# Patient Record
Sex: Female | Born: 1990
Health system: Southern US, Community
[De-identification: ages and names within clinical notes are randomized; demographics above are authoritative.]

## PROBLEM LIST (undated history)

## (undated) DIAGNOSIS — R87629 Unspecified abnormal cytological findings in specimens from vagina: Secondary | ICD-10-CM

## (undated) DIAGNOSIS — E079 Disorder of thyroid, unspecified: Secondary | ICD-10-CM

## (undated) DIAGNOSIS — G43909 Migraine, unspecified, not intractable, without status migrainosus: Secondary | ICD-10-CM

## (undated) DIAGNOSIS — F988 Other specified behavioral and emotional disorders with onset usually occurring in childhood and adolescence: Secondary | ICD-10-CM

## (undated) HISTORY — PX: COLPOSCOPY: SHX161

## (undated) HISTORY — DX: Unspecified abnormal cytological findings in specimens from vagina: R87.629

## (undated) HISTORY — PX: WISDOM TOOTH EXTRACTION: SHX21

## (undated) HISTORY — DX: Disorder of thyroid, unspecified: E07.9

## (undated) HISTORY — DX: Other specified behavioral and emotional disorders with onset usually occurring in childhood and adolescence: F98.8

## (undated) HISTORY — DX: Migraine, unspecified, not intractable, without status migrainosus: G43.909

---
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
## 2009-06-08 ENCOUNTER — Encounter: Payer: Self-pay | Admitting: Orthopedic Surgery

## 2009-06-08 ENCOUNTER — Emergency Department (HOSPITAL_COMMUNITY): Admission: EM | Admit: 2009-06-08 | Discharge: 2009-06-08 | Payer: Self-pay | Admitting: Emergency Medicine

## 2009-06-09 ENCOUNTER — Ambulatory Visit: Payer: Self-pay | Admitting: Orthopedic Surgery

## 2009-06-09 DIAGNOSIS — S82409A Unspecified fracture of shaft of unspecified fibula, initial encounter for closed fracture: Secondary | ICD-10-CM | POA: Insufficient documentation

## 2009-06-16 ENCOUNTER — Ambulatory Visit: Payer: Self-pay | Admitting: Orthopedic Surgery

## 2009-06-17 ENCOUNTER — Telehealth: Payer: Self-pay | Admitting: Orthopedic Surgery

## 2009-07-07 ENCOUNTER — Ambulatory Visit: Payer: Self-pay | Admitting: Orthopedic Surgery

## 2009-08-04 ENCOUNTER — Ambulatory Visit: Payer: Self-pay | Admitting: Orthopedic Surgery

## 2010-06-29 IMAGING — CR DG TIBIA/FIBULA 2V*L*
2 series · 2 of 2 positions shown · non-contrast
Comparison: Plain films left ankle this same date.

CLINICAL DATA: Injury, pain.

LEFT TIBIA AND FIBULA - 2 VIEW

[view not recorded (1 of 2)]
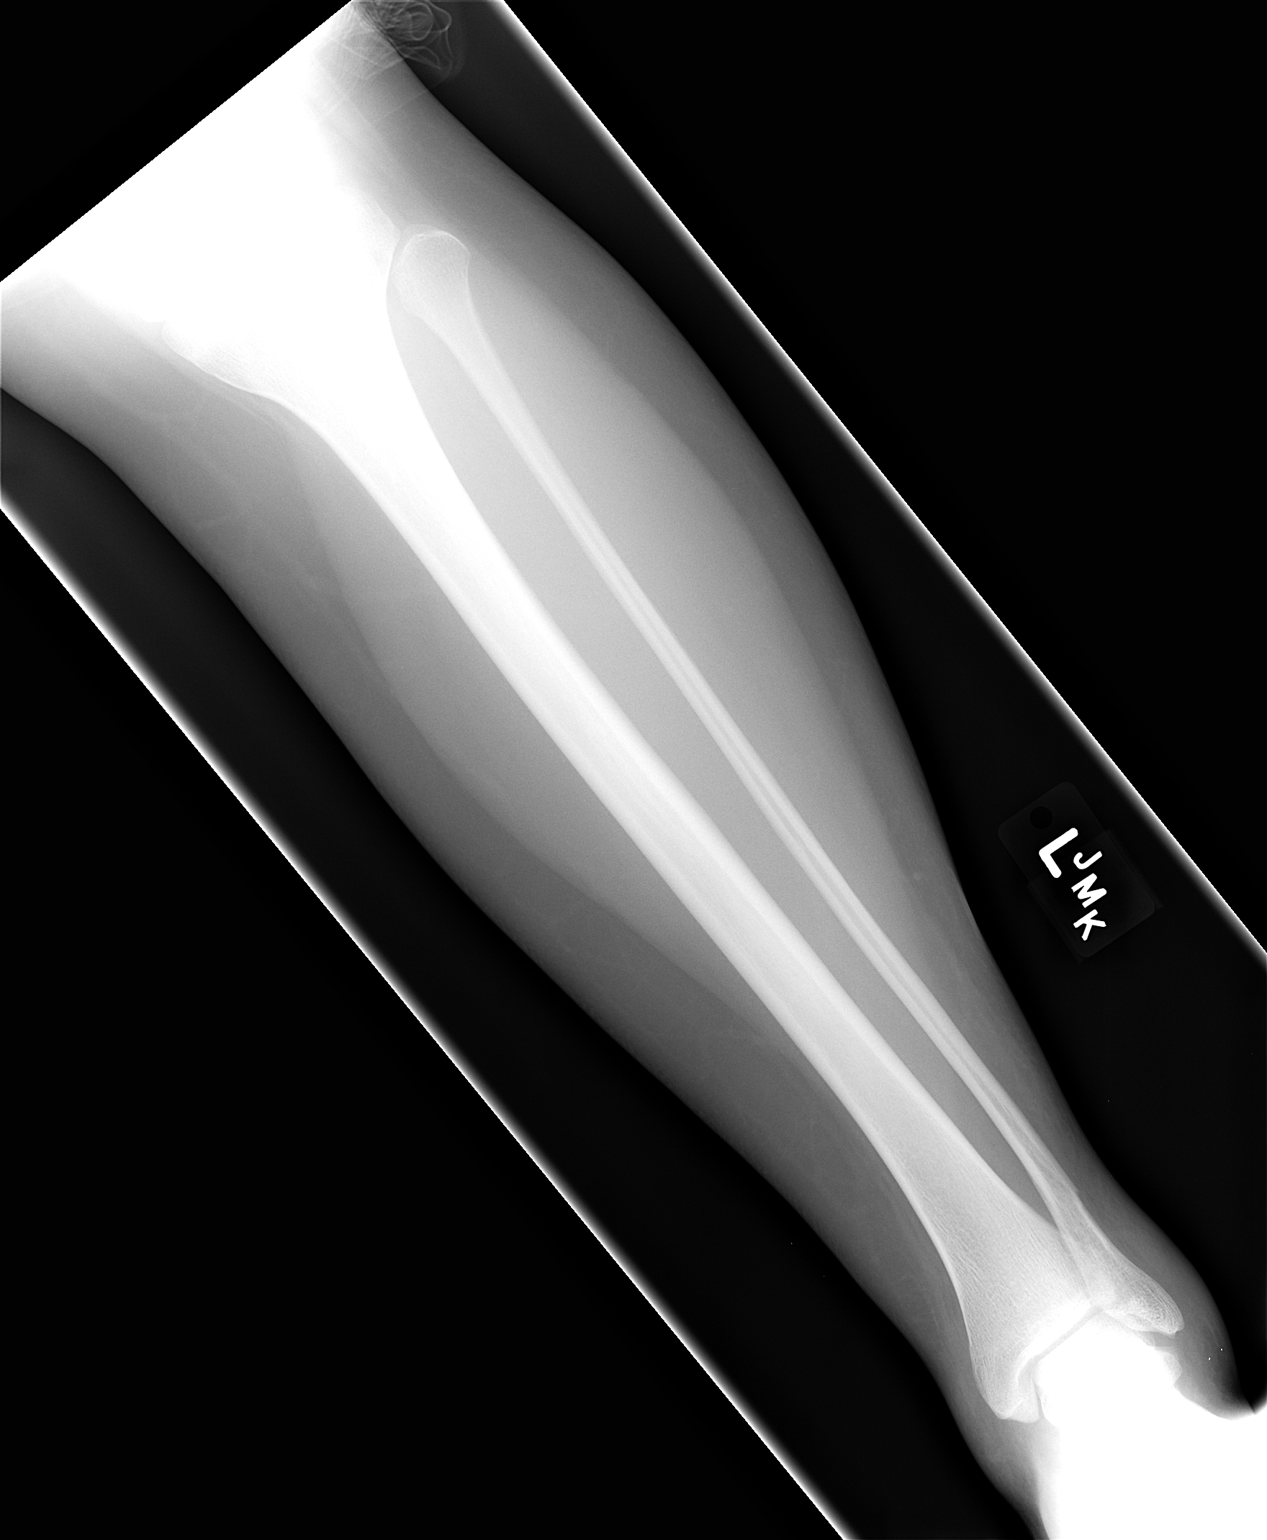

[view not recorded (2 of 2)]
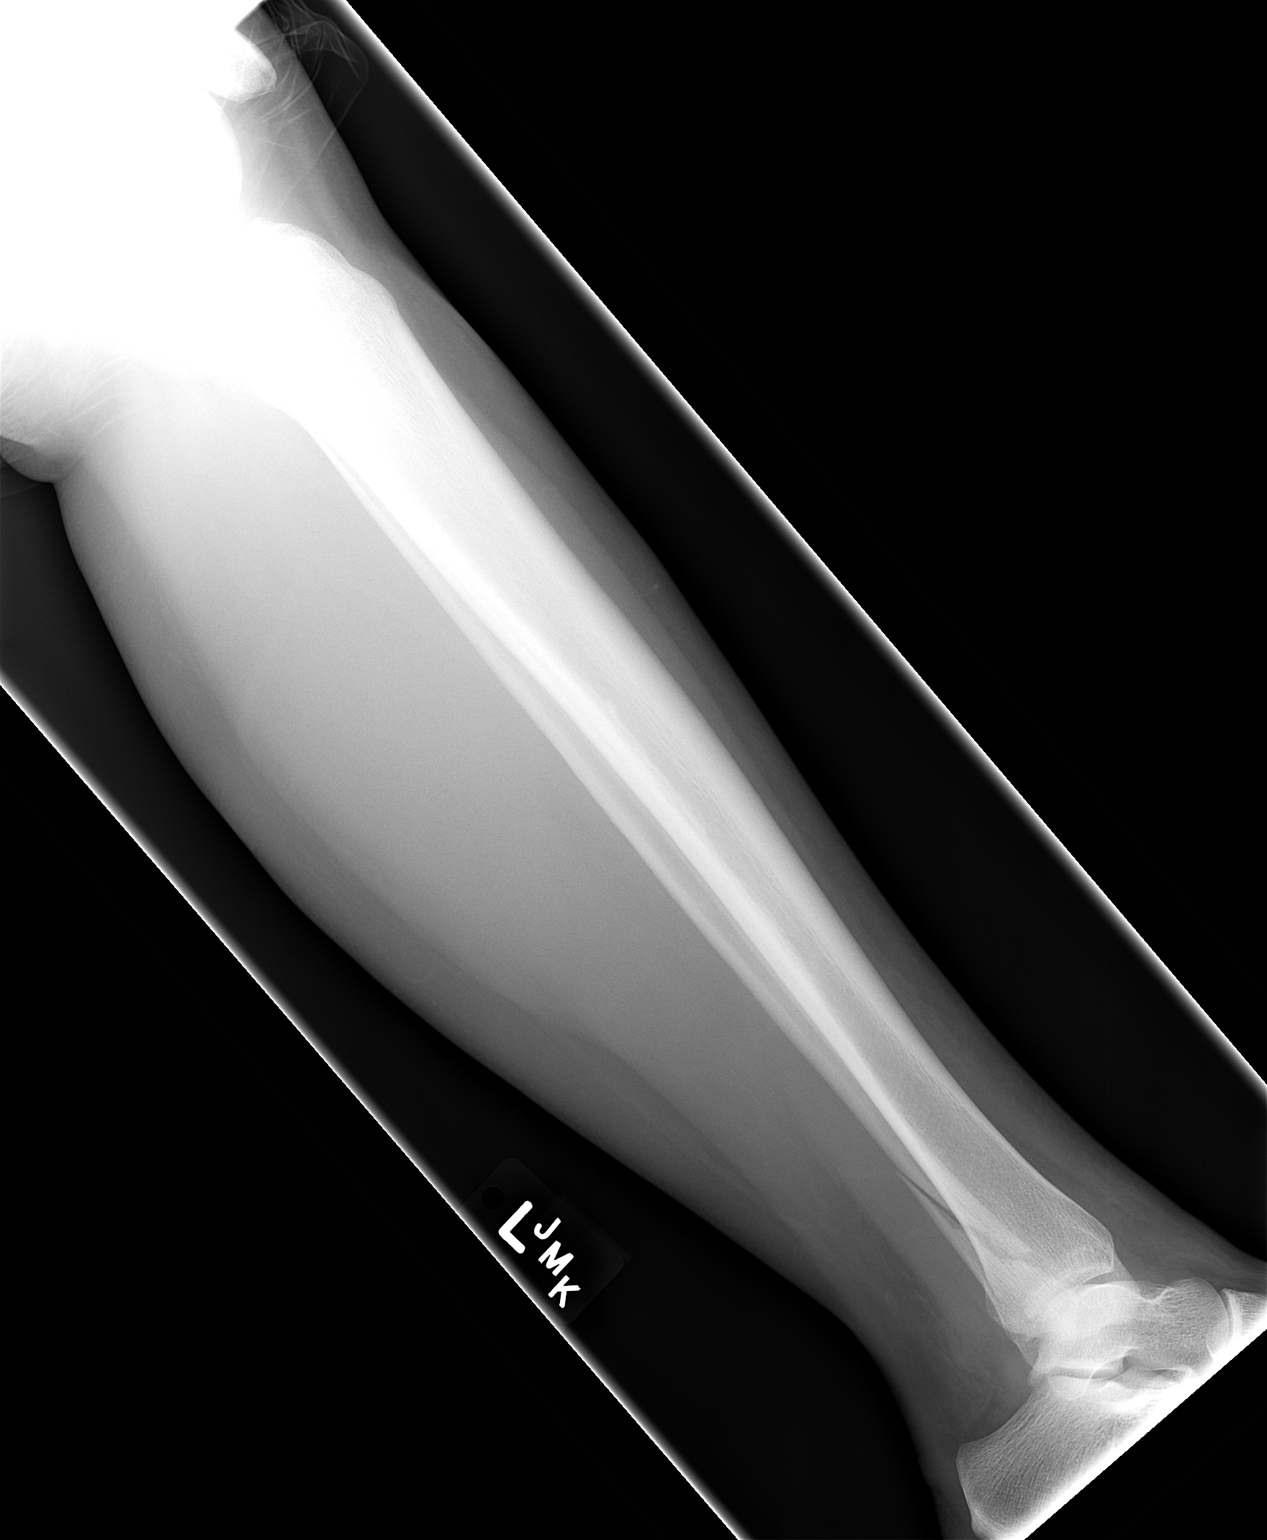

[2 of 2 positions shown; findings below may reference images not displayed]

FINDINGS: Fracture off the distal fibular diaphysis is again noted.
No other acute bony or joint abnormality is seen.
IMPRESSION: Distal fibular fracture.

## 2013-05-10 ENCOUNTER — Encounter: Payer: Self-pay | Admitting: *Deleted

## 2013-05-12 ENCOUNTER — Encounter: Payer: Self-pay | Admitting: *Deleted

## 2013-05-14 ENCOUNTER — Ambulatory Visit (INDEPENDENT_AMBULATORY_CARE_PROVIDER_SITE_OTHER): Payer: Managed Care, Other (non HMO) | Admitting: Family Medicine

## 2013-05-14 ENCOUNTER — Encounter: Payer: Self-pay | Admitting: Family Medicine

## 2013-05-14 VITALS — BP 118/84 | Ht 65.25 in | Wt 181.0 lb

## 2013-05-14 DIAGNOSIS — F909 Attention-deficit hyperactivity disorder, unspecified type: Secondary | ICD-10-CM

## 2013-05-14 MED ORDER — DEXMETHYLPHENIDATE HCL ER 20 MG PO CP24: 20.0000 mg | ORAL_CAPSULE | Freq: Every day | ORAL | Status: DC

## 2013-05-14 NOTE — Progress Notes (Signed)
  Subjective:    Patient ID: Sarah Blanchard, female    DOB: [birth year], 22 y.o.   MRN: [id number]  HPIHere for ADD check up. Not currently taking any ADD meds. Last prescribed was Ritalin. Didn't help.notes struggling in school. Difficulty focusing on studies and affecting hi studies.   Was on add med way back in first gr. Pt states it did not work well. Worked, did its job.   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  Occurs at school and at homework too. Trouble getting things accomplished.     Review of Systems  Allergic/Immunologic: Negative for environmental allergies.   no chest pain no back pain no loss of consciousness     Objective:   Physical Exam  Alert no acute distress. HEENT normal. Lungs clear. Heart regular rate and rhythm. Neuro exam intact.      Assessment & Plan:  Impression ADHD discussed at length including multiple options plan Focalin XR 20 mg every morning rationale discussed 3 months worth written recheck in 3 months call us in one month with status. WSL

## 2013-06-22 ENCOUNTER — Telehealth: Payer: Self-pay | Admitting: Family Medicine

## 2013-06-22 MED ORDER — DEXMETHYLPHENIDATE HCL ER 30 MG PO CP24: ORAL_CAPSULE | ORAL | Status: DC

## 2013-06-22 NOTE — Telephone Encounter (Signed)
Ok increase to 30xr write two rxs. rec ov towards end of second

## 2013-06-22 NOTE — Telephone Encounter (Signed)
Rx printed and left up front for patient pick up. Patient notified. 

## 2013-06-22 NOTE — Telephone Encounter (Signed)
Patient says that after taking Focalin XR for a month, she says she thinks it needs to be increased because she finds herself daydreaming just a little bit.

## 2013-06-25 ENCOUNTER — Telehealth: Payer: Self-pay | Admitting: Family Medicine

## 2013-06-25 ENCOUNTER — Other Ambulatory Visit: Payer: Self-pay | Admitting: *Deleted

## 2013-06-25 MED ORDER — DEXMETHYLPHENIDATE HCL ER 30 MG PO CP24: 30.0000 mg | ORAL_CAPSULE | Freq: Every day | ORAL | Status: DC

## 2013-06-25 NOTE — Telephone Encounter (Signed)
See 11 7 tele message. Change to written as pt requests (two rxs)

## 2013-06-25 NOTE — Telephone Encounter (Signed)
[facility] 11/10

## 2013-06-25 NOTE — Telephone Encounter (Signed)
Dexmethylphenidate HCl 30 MG CP24  Pt needs name brand only, please re write and call  Pt with new script to change out. Her ins. Requires it to be  Name brand

## 2013-06-26 NOTE — Telephone Encounter (Signed)
Spoke with pt. She is turning in her generic scripts and picking up brand scripts.

## 2013-12-25 ENCOUNTER — Encounter: Payer: Self-pay | Admitting: Family Medicine

## 2013-12-25 ENCOUNTER — Ambulatory Visit (INDEPENDENT_AMBULATORY_CARE_PROVIDER_SITE_OTHER): Payer: No Typology Code available for payment source | Admitting: Family Medicine

## 2013-12-25 VITALS — BP 114/72 | Ht 64.0 in | Wt 172.0 lb

## 2013-12-25 DIAGNOSIS — F909 Attention-deficit hyperactivity disorder, unspecified type: Secondary | ICD-10-CM

## 2013-12-25 MED ORDER — DEXMETHYLPHENIDATE HCL ER 30 MG PO CP24: 30.0000 mg | ORAL_CAPSULE | Freq: Every day | ORAL | Status: DC

## 2013-12-25 NOTE — Progress Notes (Signed)
   Subjective:    Patient ID: Sarah Blanchard, female    DOB: 1991/03/18, 23 y.o.   MRN: [id number]  HPIADD check up. Patient states no concerns. Focalin is working well for patient. Doing good in school and at work.   Feels jittery and shaky if doesn't eat  States the 30 mg dosages definitely helps. 20 mg did not help as much.  Exercising regularly. Next  No headache no change in appetite no trouble sleeping.  Review of Systems No chest pain no back pain no change in bowel habits ROS otherwise negative    Objective:   Physical Exam  Alert no apparent distress. HEENT normal. Lungs clear. Heart regular in rhythm. Neuro exam intact.      Assessment & Plan:  Impression ADHD quickly stable plan maintain same meds. Diet exercise discussed. Recheck in 4 months. WSL in an in and in

## 2014-06-25 ENCOUNTER — Ambulatory Visit (INDEPENDENT_AMBULATORY_CARE_PROVIDER_SITE_OTHER): Payer: No Typology Code available for payment source | Admitting: Family Medicine

## 2014-06-25 ENCOUNTER — Encounter: Payer: Self-pay | Admitting: Family Medicine

## 2014-06-25 VITALS — BP 120/70 | Ht 64.0 in | Wt 171.2 lb

## 2014-06-25 DIAGNOSIS — F902 Attention-deficit hyperactivity disorder, combined type: Secondary | ICD-10-CM

## 2014-06-25 DIAGNOSIS — D2262 Melanocytic nevi of left upper limb, including shoulder: Secondary | ICD-10-CM

## 2014-06-25 MED ORDER — DEXMETHYLPHENIDATE HCL ER 30 MG PO CP24: 30.0000 mg | ORAL_CAPSULE | Freq: Every day | ORAL | Status: DC

## 2014-06-25 NOTE — Progress Notes (Signed)
   Subjective:    Patient ID: Sarah Blanchard, female    DOB: 1991-06-25, 23 y.o.   MRN: [id number]  HPI  Patient arrives for an ADHD check up. Patient currently on Focalin XR 30mg . Patient also has spot on ri  Staying busy with nursing program  Exercise regimen fairly regular and at home mostly  Feels the focalin dose is working well  Day numb [phone]   ght shoulder she would like checked to see if skin cancer. Review of Systems No headache no chest pain no back pain no loss weight gain    Objective:   Physical Exam  Alert vital stable HEENT normal. Lungs clear. Heart rare rate and rhythm. Neuro exam intact. Left shoulder nevus somewhat complicated in appearance irregular      Assessment & Plan:  Impression 1 ADHD good control discussed #2 nevus needs referral plan referral, diet exercise discussed, ADHD meds refilled for 4 months. WSL

## 2015-01-01 ENCOUNTER — Ambulatory Visit (INDEPENDENT_AMBULATORY_CARE_PROVIDER_SITE_OTHER): Payer: Managed Care, Other (non HMO) | Admitting: Family Medicine

## 2015-01-01 ENCOUNTER — Encounter: Payer: Self-pay | Admitting: Family Medicine

## 2015-01-01 VITALS — BP 118/72 | Ht 63.0 in | Wt 158.0 lb

## 2015-01-01 DIAGNOSIS — M545 Low back pain, unspecified: Secondary | ICD-10-CM

## 2015-01-01 DIAGNOSIS — Z0000 Encounter for general adult medical examination without abnormal findings: Secondary | ICD-10-CM | POA: Diagnosis not present

## 2015-01-01 DIAGNOSIS — F902 Attention-deficit hyperactivity disorder, combined type: Secondary | ICD-10-CM

## 2015-01-01 DIAGNOSIS — Z111 Encounter for screening for respiratory tuberculosis: Secondary | ICD-10-CM | POA: Diagnosis not present

## 2015-01-01 DIAGNOSIS — Z139 Encounter for screening, unspecified: Secondary | ICD-10-CM

## 2015-01-01 NOTE — Progress Notes (Signed)
   Subjective:    Patient ID: Sarah Blanchard, female    DOB: [birth year], 24 y.o.   MRN: [id number]  HPI The patient comes in today for a wellness visit.  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  Vision 20/20 bilateral without correction.  Color blindness test was normal.   A review of their health history was completed.  A review of medications was also completed.  Any needed refills; none  Eating habits: health conscious  Falls/  MVA accidents in past few months: none  Regular exercise: weight lifting, running and jogging  Specialist pt sees on regular basis: Dr. Aloha Gell ob/gyn  Preventative health issues were discussed.   Additional concerns: refill on adipex. Was originally filled by gyn. Pt states she has been taking for 2 months. Pt has lost 40lbs.   Stopped the focalin, adipex sems to partially help,   Patient has history of ADHD. Was on Focalin 4. Seem to be helping. Patient's GYN started her on phentermine. She has stopped the Focalin therefore.  Patient has history of reflux. Nexium generally helps her symptomatology.    Review of Systems  Constitutional: Negative for activity change, appetite change and fatigue.  HENT: Negative for congestion, ear discharge and rhinorrhea.   Eyes: Negative for discharge.  Respiratory: Negative for cough, chest tightness and wheezing.   Cardiovascular: Negative for chest pain.  Gastrointestinal: Negative for vomiting and abdominal pain.  Genitourinary: Negative for frequency and difficulty urinating.  Musculoskeletal: Negative for neck pain.  Allergic/Immunologic: Negative for environmental allergies and food allergies.  Neurological: Negative for weakness and headaches.  Psychiatric/Behavioral: Negative for behavioral problems and agitation.  All other systems reviewed and are negative.      Objective:   Physical Exam  Constitutional: She is oriented to person, place, and time. She appears well-developed  and well-nourished.  HENT:  Head: Normocephalic.  Right Ear: External ear normal.  Left Ear: External ear normal.  Eyes: Pupils are equal, round, and reactive to light.  Neck: Normal range of motion. No thyromegaly present.  Cardiovascular: Normal rate, regular rhythm, normal heart sounds and intact distal pulses.   No murmur heard. Pulmonary/Chest: Effort normal and breath sounds normal. No respiratory distress. She has no wheezes.  Abdominal: Soft. Bowel sounds are normal. She exhibits no distension and no mass. There is no tenderness.  Musculoskeletal: Normal range of motion. She exhibits no edema or tenderness.  Lymphadenopathy:    She has no cervical adenopathy.  Neurological: She is alert and oriented to person, place, and time. She exhibits normal muscle tone.  Skin: Skin is warm and dry.  Psychiatric: She has a normal mood and affect. Her behavior is normal.  Vitals reviewed.         Assessment & Plan:  Impression 1 ADHD overall stable per patient #2 low back pain discuss patient notes worse with certain motions. Comes and goes. Does try to exercise some. #3 weight loss patient currently prescribed phentermine by specialist and form filled out. Appropriate blood tests to evaluate PPD status. Form filled out. Of note this is not a true physicals since that is provided through GYN. Diet exercise discussed also

## 2015-01-04 LAB — QUANTIFERON IN TUBE
QFT TB AG MINUS NIL VALUE: <0 IU/mL
QUANTIFERON MITOGEN VALUE: 9.77 IU/mL
QUANTIFERON TB AG VALUE: 0.03 IU/mL
QUANTIFERON TB GOLD: NEGATIVE
Quantiferon Nil Value: 0.04 IU/mL

## 2015-01-04 LAB — QUANTIFERON TB GOLD ASSAY (BLOOD)

## 2015-09-30 ENCOUNTER — Ambulatory Visit (INDEPENDENT_AMBULATORY_CARE_PROVIDER_SITE_OTHER): Payer: Managed Care, Other (non HMO) | Admitting: Family Medicine

## 2015-09-30 ENCOUNTER — Encounter: Payer: Self-pay | Admitting: Family Medicine

## 2015-09-30 VITALS — BP 112/70 | Ht 64.0 in | Wt 174.0 lb

## 2015-09-30 DIAGNOSIS — F902 Attention-deficit hyperactivity disorder, combined type: Secondary | ICD-10-CM

## 2015-09-30 MED ORDER — DEXMETHYLPHENIDATE HCL ER 30 MG PO CP24: 30.0000 mg | ORAL_CAPSULE | Freq: Every day | ORAL | Status: DC

## 2015-09-30 NOTE — Progress Notes (Signed)
   Subjective:    Patient ID: Sarah Blanchard, female    DOB: 04/19/1991, 25 y.o.   MRN: [id number]  HPI Patient was seen today for ADD checkup. -weight, vital signs reviewed.  The following items were covered. 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

## 2016-03-19 DIAGNOSIS — Z6831 Body mass index (BMI) 31.0-31.9, adult: Secondary | ICD-10-CM | POA: Diagnosis not present

## 2016-03-19 DIAGNOSIS — E669 Obesity, unspecified: Secondary | ICD-10-CM | POA: Diagnosis not present

## 2016-04-20 DIAGNOSIS — E669 Obesity, unspecified: Secondary | ICD-10-CM | POA: Diagnosis not present

## 2016-04-20 DIAGNOSIS — Z6832 Body mass index (BMI) 32.0-32.9, adult: Secondary | ICD-10-CM | POA: Diagnosis not present

## 2016-06-24 DIAGNOSIS — W57XXXA Bitten or stung by nonvenomous insect and other nonvenomous arthropods, initial encounter: Secondary | ICD-10-CM | POA: Diagnosis not present

## 2016-06-24 DIAGNOSIS — B9689 Other specified bacterial agents as the cause of diseases classified elsewhere: Secondary | ICD-10-CM | POA: Diagnosis not present

## 2016-06-24 DIAGNOSIS — L089 Local infection of the skin and subcutaneous tissue, unspecified: Secondary | ICD-10-CM | POA: Diagnosis not present

## 2016-06-24 DIAGNOSIS — Z719 Counseling, unspecified: Secondary | ICD-10-CM | POA: Diagnosis not present

## 2016-06-24 DIAGNOSIS — S30861A Insect bite (nonvenomous) of abdominal wall, initial encounter: Secondary | ICD-10-CM | POA: Diagnosis not present

## 2016-06-28 ENCOUNTER — Encounter: Payer: Self-pay | Admitting: Family Medicine

## 2016-06-28 ENCOUNTER — Ambulatory Visit (INDEPENDENT_AMBULATORY_CARE_PROVIDER_SITE_OTHER): Payer: 59 | Admitting: Family Medicine

## 2016-06-28 VITALS — BP 110/72 | Temp 98.4°F | Ht 63.0 in | Wt 189.8 lb

## 2016-06-28 DIAGNOSIS — J029 Acute pharyngitis, unspecified: Secondary | ICD-10-CM

## 2016-06-28 DIAGNOSIS — I889 Nonspecific lymphadenitis, unspecified: Secondary | ICD-10-CM

## 2016-06-28 LAB — POCT RAPID STREP A (OFFICE): Rapid Strep A Screen: NEGATIVE

## 2016-06-28 MED ORDER — AZITHROMYCIN 250 MG PO TABS
ORAL_TABLET | ORAL | 0 refills | Status: DC
Start: 2016-06-28 — End: 2018-03-07

## 2016-06-28 NOTE — Progress Notes (Signed)
   Subjective:    Patient ID: Sarah Blanchard, female    DOB: 01-Dec-1990, 25 y.o.   MRN: [id number]  Sore Throat   This is a new problem. The current episode started yesterday. Associated symptoms include headaches and vomiting. Associated symptoms comments: Fever, muscle aches. Treatments tried: max flu.   Sore throat hit prety hard  Feels achey  occas cough  Results for orders placed or performed in visit on 06/28/16  POCT rapid strep A  Result Value Ref Range   Rapid Strep A Screen Negative Negative    vo times   300 seen sickness  Used to have zofran none now,      Review of Systems  Gastrointestinal: Positive for vomiting.  Neurological: Positive for headaches.       Objective:   Physical Exam Alert no acute distress but moderate malaise lungs clear. Heart rare rhythm pharynx erythematous tender anterior nodes. 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 Strep screen negative plan Z-Pak for atypical agents. Has been around a lot of community acquired pneumonia lately symptom care discussed

## 2016-06-29 LAB — STREP A DNA PROBE: Strep Gp A Direct, DNA Probe: NEGATIVE

## 2017-08-29 DIAGNOSIS — Z131 Encounter for screening for diabetes mellitus: Secondary | ICD-10-CM | POA: Diagnosis not present

## 2017-08-29 DIAGNOSIS — R635 Abnormal weight gain: Secondary | ICD-10-CM | POA: Diagnosis not present

## 2017-08-29 DIAGNOSIS — Z6835 Body mass index (BMI) 35.0-35.9, adult: Secondary | ICD-10-CM | POA: Diagnosis not present

## 2017-08-29 DIAGNOSIS — Z9889 Other specified postprocedural states: Secondary | ICD-10-CM | POA: Diagnosis not present

## 2017-08-29 DIAGNOSIS — Z0000 Encounter for general adult medical examination without abnormal findings: Secondary | ICD-10-CM | POA: Diagnosis not present

## 2017-08-29 DIAGNOSIS — Z01419 Encounter for gynecological examination (general) (routine) without abnormal findings: Secondary | ICD-10-CM | POA: Diagnosis not present

## 2017-10-04 DIAGNOSIS — D2230 Melanocytic nevi of unspecified part of face: Secondary | ICD-10-CM | POA: Diagnosis not present

## 2017-10-04 DIAGNOSIS — M25549 Pain in joints of unspecified hand: Secondary | ICD-10-CM | POA: Diagnosis not present

## 2017-10-04 DIAGNOSIS — L309 Dermatitis, unspecified: Secondary | ICD-10-CM | POA: Diagnosis not present

## 2017-12-20 DIAGNOSIS — L408 Other psoriasis: Secondary | ICD-10-CM | POA: Diagnosis not present

## 2018-02-15 ENCOUNTER — Encounter: Payer: Self-pay | Admitting: *Deleted

## 2018-02-15 ENCOUNTER — Ambulatory Visit: Payer: 59 | Admitting: Gastroenterology

## 2018-02-15 ENCOUNTER — Other Ambulatory Visit: Payer: Self-pay | Admitting: *Deleted

## 2018-02-15 ENCOUNTER — Encounter: Payer: Self-pay | Admitting: Gastroenterology

## 2018-02-15 ENCOUNTER — Telehealth: Payer: Self-pay | Admitting: *Deleted

## 2018-02-15 DIAGNOSIS — R1310 Dysphagia, unspecified: Secondary | ICD-10-CM

## 2018-02-15 DIAGNOSIS — R1013 Epigastric pain: Secondary | ICD-10-CM

## 2018-02-15 DIAGNOSIS — K219 Gastro-esophageal reflux disease without esophagitis: Secondary | ICD-10-CM

## 2018-02-15 MED ORDER — PANTOPRAZOLE SODIUM 40 MG PO TBEC: DELAYED_RELEASE_TABLET | ORAL | 11 refills | Status: DC

## 2018-02-15 NOTE — Assessment & Plan Note (Signed)
SYMPTOMS NOT IDEALLY CONTROLLED AND LIKELY DUE TO GERD.   DRINK WATER TO KEEP YOUR URINE LIGHT YELLOW. CONTINUE YOUR WEIGHT LOSS EFFORTS. A WEIGHT OF 173 LBS OR LESS WILL KEEP HER BODY MASS INDEX(BMI) UNDER 30. PT AWARE. STRICTLY FOLLOW A LOW FAT DIET. MEATS SHOULD BE BAKED, BROILED, OR BOILED. AVOID FRIED FOODS.  HANDOUT GIVEN. STRICTLY AVOID REFLUX TRIGGERS.  HANDOUT GIVEN. CONTINUE PROTONIX. INCREASE TO 30 MINUTES PRIOR TO MEALS TWICE DAILY. COMPLETE UPPER ENDOSCOPY/POSSIBLE DILATION IN 2 3 WEEKS. DISCUSSED PROCEDURE, BENEFITS, & RISKS: < 1% chance of medication reaction, bleeding, OR perforation.  FOLLOW UP IN 4 MOS.

## 2018-02-15 NOTE — Assessment & Plan Note (Signed)
SYMPTOMS FAIRLY WELL CONTROLLED AND LIKELY DUE TO UNCONTROLLED GERD.   DRINK WATER TO KEEP YOUR URINE LIGHT YELLOW. CONTINUE YOUR WEIGHT LOSS EFFORTS. A WEIGHT OF 173 LBS OR LESS WILL KEEP YOUR BODY MASS INDEX(BMI) UNDER 30. FOLLOW A LOW FAT DIET. MEATS SHOULD BE BAKED, BROILED, OR BOILED. AVOID FRIED FOODS.  HANDOUT GIVEN. AVOID REFLUX TRIGGERS.  HANDOUT GIVEN. CONTINUE PROTONIX. INCREASE TO  30 MINUTES PRIOR TO MEALS TWICE DAILY. COMPLETE UPPER ENDOSCOPY/POSSIBLE DILATION IN 2 3 WEEKS. DISCUSSED PROCEDURE, BENEFITS, & RISKS: < 1% chance of medication reaction, bleeding, OR perforation.  FOLLOW UP IN 4 MOS.

## 2018-02-15 NOTE — Telephone Encounter (Signed)
The notification/prior authorization reference number is [id number] for PA started via [facility] website for EGD +/-DIL.

## 2018-02-15 NOTE — Assessment & Plan Note (Signed)
SYMPTOMS NOT IDEALLY CONTROLLED.   DRINK WATER TO KEEP YOUR URINE LIGHT YELLOW. CONTINUE YOUR WEIGHT LOSS EFFORTS. A WEIGHT OF 173 LBS OR LESS WILL KEEP YOUR BODY MASS INDEX(BMI) UNDER 30. STRICTLY FOLLOW A LOW FAT DIET. MEATS SHOULD BE BAKED, BROILED, OR BOILED. AVOID FRIED FOODS.  HANDOUT GIVEN. STRICTLY AVOID REFLUX TRIGGERS.  HANDOUT GIVEN. CONTINUE PROTONIX. INCREASE TO 30 MINUTES PRIOR TO MEALS TWICE DAILY. COMPLETE UPPER ENDOSCOPY/POSSIBLE DILATION IN 2 3 WEEKS. DISCUSSED PROCEDURE, BENEFITS, & RISKS: < 1% chance of medication reaction, bleeding, OR perforation.  FOLLOW UP IN 4 MOS.

## 2018-02-15 NOTE — Progress Notes (Signed)
Subjective:    Patient ID: Sarah Blanchard, female    DOB: Sep 23, 1990, 27 y.o.   MRN: [id number]  Mikey Kirschner, MD   HPI RARE MONSTER [place]. WORKING ON WEIGHT LOSS: DOWN 23 LBS SO FAR. HEARTBURN NOT IDEALLY CONTROLLED. SYMPTOMS EVERY DAY. LAST MOST OF THE DAY. MAY BE UP AT LEAST 2 NIGHTS A WEEK WITH SYMPTOMS. SYMPTOMS: BURPING AND BURNING AFTERWARDS AND HURTS IN ESOPHAGUS AND TRACHEA AREA. BMs: EVERY COUPLE OF DAYS. RARE CONSTIPATION @ CYCLES.MAY FEELS LIKE THINGS GETS STUCK GOING DOWN < 1-2X/WEEK. NEVER HAD EGD. USES IBUPROFEN(< 1-2X/MO). ON MYRENA. RARE ETOH. QUIT SMOKING 1 YR AGO. WORKING FULL TIME AND GOING TO SCHOOL FULL TIME. GETTING PSYCH DEGREE. PLANNING ON DOING NURSING.   PT DENIES FEVER, CHILLS, HEMATOCHEZIA, nausea, vomiting, melena, diarrhea, SHORTNESS OF BREATH, CHANGE IN BOWEL IN HABITS, OR problems with sedation.   Past Medical History:  Diagnosis Date  . ADD (attention deficit disorder)   . Migraines   . Thyroid disease 2004   hypothyrodism   Past Surgical History:  Procedure Laterality Date  . THYROIDECTOMY, PARTIAL  2004   Allergies  Allergen Reactions  . Bee Venom   . Sulfa Antibiotics     Current Outpatient Medications  Medication Sig Dispense Refill  . pantoprazole (PROTONIX) 40 MG tablet Take 1 tablet by mouth daily.    .      . Cholecalciferol (VITAMIN D) 2000 UNITS CAPS Take by mouth daily.    .      .        Family History  Problem Relation Age of Onset  . Heart disease Other   . Colon cancer Neg Hx   . Colon polyps Neg Hx    Social History   Socioeconomic History  . Marital status: MARRIED    Spouse name: BEN  . Number of children: NONE  . Years of education: WILL HAVE BACHELOR'S IN [facility]  . Highest education level:   Occupational History  . Not on file  Social Needs  . Financial resource strain: NO STRESS  . Food insecurity:            . Transportation needs:     HAS OWN CAR       Tobacco Use  . Smoking  status: Former Research scientist (life sciences)  . Smokeless tobacco: Never Used  Substance and Sexual Activity  . Alcohol use: Yes    Alcohol/week: 0.0 oz    Comment: occ  . Drug use: Never  . Sexual activity: Not on file  Lifestyle  . Physical activity:            . Stress: MODERATE STRESS DUE TO NURSING SCHOOL  Relationships  . Social connections:                                Other Topics Concern  . Not on file  Social History Narrative   MARRIED. NO KIDS. 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    Objective:   Physical Exam  Constitutional: She is oriented to person, place, and time. She appears well-developed and well-nourished. No distress.  HENT:  Head: Normocephalic and atraumatic.  Mouth/Throat: Oropharynx is clear and moist. No oropharyngeal exudate.  Eyes: Pupils are equal, round, and reactive to light. No scleral icterus.  Neck: Normal range of motion. Neck supple.  Cardiovascular: Normal rate, regular  rhythm and normal heart sounds.  Pulmonary/Chest: Effort normal and breath sounds normal. No respiratory distress.  Abdominal: Soft. Bowel sounds are normal. She exhibits no distension. There is no tenderness.  Musculoskeletal: She exhibits no edema.  Neurological: She is alert and oriented to person, place, and time.  NO  NEW FOCAL DEFICITS  Psychiatric: She has a normal mood and affect.  Vitals reviewed.      Assessment & Plan:

## 2018-02-15 NOTE — Addendum Note (Signed)
Addended by: Danie Binder on: 02/15/2018 10:17 AM   Modules accepted: Orders

## 2018-02-15 NOTE — Progress Notes (Signed)
CC'ED TO PCP 

## 2018-02-15 NOTE — H&P (View-Only) (Signed)
Subjective:    Patient ID: Sarah Blanchard, female    DOB: [birth year], 27 y.o.   MRN: [id number]  Mikey Kirschner, MD   HPI RARE MONSTER [place]. WORKING ON WEIGHT LOSS: DOWN 23 LBS SO FAR. HEARTBURN NOT IDEALLY CONTROLLED. SYMPTOMS EVERY DAY. LAST MOST OF THE DAY. MAY BE UP AT LEAST 2 NIGHTS A WEEK WITH SYMPTOMS. SYMPTOMS: BURPING AND BURNING AFTERWARDS AND HURTS IN ESOPHAGUS AND TRACHEA AREA. BMs: EVERY COUPLE OF DAYS. RARE CONSTIPATION @ CYCLES.MAY FEELS LIKE THINGS GETS STUCK GOING DOWN < 1-2X/WEEK. NEVER HAD EGD. USES IBUPROFEN(< 1-2X/MO). ON MYRENA. RARE ETOH. QUIT SMOKING 1 YR AGO. WORKING FULL TIME AND GOING TO SCHOOL FULL TIME. GETTING PSYCH DEGREE. PLANNING ON DOING NURSING.   PT DENIES FEVER, CHILLS, HEMATOCHEZIA, nausea, vomiting, melena, diarrhea, SHORTNESS OF BREATH, CHANGE IN BOWEL IN HABITS, OR problems with sedation.   Past Medical History:  Diagnosis Date  . ADD (attention deficit disorder)   . Migraines   . Thyroid disease 2004   hypothyrodism   Past Surgical History:  Procedure Laterality Date  . THYROIDECTOMY, PARTIAL  2004   Allergies  Allergen Reactions  . Bee Venom   . Sulfa Antibiotics     Current Outpatient Medications  Medication Sig Dispense Refill  . pantoprazole (PROTONIX) 40 MG tablet Take 1 tablet by mouth daily.    .      . Cholecalciferol (VITAMIN D) 2000 UNITS CAPS Take by mouth daily.    .      .        Family History  Problem Relation Age of Onset  . Heart disease Other   . Colon cancer Neg Hx   . Colon polyps Neg Hx    Social History   Socioeconomic History  . Marital status: MARRIED    Spouse name: BEN  . Number of children: NONE  . Years of education: WILL HAVE BACHELOR'S IN [facility]  . Highest education level:   Occupational History  . Not on file  Social Needs  . Financial resource strain: NO STRESS  . Food insecurity:            . Transportation needs:     HAS OWN CAR       Tobacco Use  . Smoking  status: Former Research scientist (life sciences)  . Smokeless tobacco: Never Used  Substance and Sexual Activity  . Alcohol use: Yes    Alcohol/week: 0.0 oz    Comment: occ  . Drug use: Never  . Sexual activity: Not on file  Lifestyle  . Physical activity:            . Stress: MODERATE STRESS DUE TO NURSING SCHOOL  Relationships  . Social connections:                                Other Topics Concern  . Not on file  Social History Narrative   MARRIED. NO KIDS. 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    Objective:   Physical Exam  Constitutional: She is oriented to person, place, and time. She appears well-developed and well-nourished. No distress.  HENT:  Head: Normocephalic and atraumatic.  Mouth/Throat: Oropharynx is clear and moist. No oropharyngeal exudate.  Eyes: Pupils are equal, round, and reactive to light. No scleral icterus.  Neck: Normal range of motion. Neck supple.  Cardiovascular: Normal rate, regular  rhythm and normal heart sounds.  Pulmonary/Chest: Effort normal and breath sounds normal. No respiratory distress.  Abdominal: Soft. Bowel sounds are normal. She exhibits no distension. There is no tenderness.  Musculoskeletal: She exhibits no edema.  Neurological: She is alert and oriented to person, place, and time.  NO  NEW FOCAL DEFICITS  Psychiatric: She has a normal mood and affect.  Vitals reviewed.      Assessment & Plan:

## 2018-02-15 NOTE — Patient Instructions (Addendum)
DRINK WATER TO KEEP YOUR URINE LIGHT YELLOW.  CONTINUE YOUR WEIGHT LOSS EFFORTS. A WEIGHT OF 173 LBS OR LESS WILL KEEP YOUR BODY MASS INDEX(BMI) UNDER 30.  FOLLOW A LOW FAT DIET. MEATS SHOULD BE BAKED, BROILED, OR BOILED. AVOID FRIED FOODS. SEE INFO BELOW.   AVOID REFLUX TRIGGERS. SEE INFO BELOW.   CONTINUE PROTONIX. INCREASE TO  30 MINUTES PRIOR TO MEALS TWICE DAILY.   COMPLETE UPPER ENDOSCOPY IN 2 3 WEEKS.   FOLLOW UP IN 4 MOS.      Lifestyle and home remedies TO MANAGE REFLUX/CHEST PAIN  You may eliminate or reduce the frequency of heartburn by making the following lifestyle changes:  . Control your weight. Being overweight is a major risk factor for heartburn and GERD. Excess pounds put pressure on your abdomen, pushing up your stomach and causing acid to back up into your esophagus.   . Eat smaller meals. 4 TO 6 MEALS A DAY. This reduces pressure on the lower esophageal sphincter, helping to prevent the valve from opening and acid from washing back into your esophagus.   Dolphus Jenny your belt. Clothes that fit tightly around your waist put pressure on your abdomen and the lower esophageal sphincter.   . Eliminate heartburn triggers. Everyone has specific triggers. Common triggers such as fatty or fried foods, spicy food, tomato sauce, carbonated beverages, alcohol, chocolate, mint, garlic, onion, caffeine and nicotine may make heartburn worse.   Marland Kitchen Avoid stooping or bending. Tying your shoes is OK. Bending over for longer periods to weed your garden isn't, especially soon after eating.   . Don't lie down after a meal. Wait at least three to four hours after eating before going to bed, and don't lie down right after eating.   Marland Kitchen PUT THE HEAD OF YOUR BED ON 6 INCH BLOCKS OR SLEEP ON A WEDGE NOT A PILE OF PILLOWS.   Alternative medicine . Several home remedies exist for treating GERD, but they provide only temporary relief. They include drinking baking soda (sodium bicarbonate)  added to water or drinking other fluids such as baking soda mixed with cream of tartar and water.  . Although these liquids create temporary relief by neutralizing, washing away or buffering acids, eventually they aggravate the situation by adding gas and fluid to your stomach, increasing pressure and causing more acid reflux. Further, adding more sodium to your diet may increase your blood pressure and add stress to your heart, and excessive bicarbonate ingestion can alter the acid-base balance in your body.      Low-Fat Diet BREADS, CEREALS, PASTA, RICE, DRIED PEAS, AND BEANS These products are high in carbohydrates and most are low in fat. Therefore, they can be increased in the diet as substitutes for fatty foods. They too, however, contain calories and should not be eaten in excess. Cereals can be eaten for snacks as well as for breakfast.   FRUITS AND VEGETABLES It is good to eat fruits and vegetables. Besides being sources of fiber, both are rich in vitamins and some minerals. They help you get the daily allowances of these nutrients. Fruits and vegetables can be used for snacks and desserts.  MEATS Limit lean meat, chicken, [place], and fish to no more than 6 ounces per day. Beef, Pork, and Lamb Use lean cuts of beef, pork, and lamb. Lean cuts include:  Extra-lean ground beef.  Arm roast.  Sirloin tip.  Center-cut ham.  Round steak.  Loin chops.  Rump roast.  Tenderloin.  Trim all  fat off the outside of meats before cooking. It is not necessary to severely decrease the intake of red meat, but lean choices should be made. Lean meat is rich in protein and contains a highly absorbable form of iron. Premenopausal women, in particular, should avoid reducing lean red meat because this could increase the risk for low red blood cells (iron-deficiency anemia).  Chicken and [place] These are good sources of protein. The fat of poultry can be reduced by removing the skin and underlying fat  layers before cooking. Chicken and [place] can be substituted for lean red meat in the diet. Poultry should not be fried or covered with high-fat sauces. Fish and Shellfish Fish is a good source of protein. Shellfish contain cholesterol, but they usually are low in saturated fatty acids. The preparation of fish is important. Like chicken and [place], they should not be fried or covered with high-fat sauces. EGGS Egg whites contain no fat or cholesterol. They can be eaten often. Try 1 to 2 egg whites instead of whole eggs in recipes or use egg substitutes that do not contain yolk. MILK AND DAIRY PRODUCTS Use skim or 1% milk instead of 2% or whole milk. Decrease whole milk, natural, and processed cheeses. Use nonfat or low-fat (2%) cottage cheese or low-fat cheeses made from vegetable oils. Choose nonfat or low-fat (1 to 2%) yogurt. Experiment with evaporated skim milk in recipes that call for heavy cream. Substitute low-fat yogurt or low-fat cottage cheese for sour cream in dips and salad dressings. Have at least 2 servings of low-fat dairy products, such as 2 glasses of skim (or 1%) milk each day to help get your daily calcium intake. FATS AND OILS Reduce the total intake of fats, especially saturated fat. Butterfat, lard, and beef fats are high in saturated fat and cholesterol. These should be avoided as much as possible. Vegetable fats do not contain cholesterol, but certain vegetable fats, such as coconut oil, palm oil, and palm kernel oil are very high in saturated fats. These should be limited. These fats are often used in bakery goods, processed foods, popcorn, oils, and nondairy creamers. Vegetable shortenings and some peanut butters contain hydrogenated oils, which are also saturated fats. Read the labels on these foods and check for saturated vegetable oils. Unsaturated vegetable oils and fats do not raise blood cholesterol. However, they should be limited because they are fats and are high in  calories. Total fat should still be limited to 30% of your daily caloric intake. Desirable liquid vegetable oils are corn oil, cottonseed oil, olive oil, canola oil, safflower oil, soybean oil, and sunflower oil. Peanut oil is not as good, but small amounts are acceptable. Buy a heart-healthy tub margarine that has no partially hydrogenated oils in the ingredients. Mayonnaise and salad dressings often are made from unsaturated fats, but they should also be limited because of their high calorie and fat content. Seeds, nuts, peanut butter, olives, and avocados are high in fat, but the fat is mainly the unsaturated type. These foods should be limited mainly to avoid excess calories and fat. OTHER EATING TIPS Snacks  Most sweets should be limited as snacks. They tend to be rich in calories and fats, and their caloric content outweighs their nutritional value. Some good choices in snacks are graham crackers, melba toast, soda crackers, bagels (no egg), English muffins, fruits, and vegetables. These snacks are preferable to snack crackers, [place] fries, TORTILLA CHIPS, and POTATO chips. Popcorn should be air-popped or cooked in small  amounts of liquid vegetable oil. Desserts Eat fruit, low-fat yogurt, and fruit ices instead of pastries, cake, and cookies. Sherbet, angel food cake, gelatin dessert, frozen low-fat yogurt, or other frozen products that do not contain saturated fat (pure fruit juice bars, frozen ice pops) are also acceptable.  COOKING METHODS Choose those methods that use little or no fat. They include: Poaching.  Braising.  Steaming.  Grilling.  Baking.  Stir-frying.  Broiling.  Microwaving.  Foods can be cooked in a nonstick pan without added fat, or use a nonfat cooking spray in regular cookware. Limit fried foods and avoid frying in saturated fat. Add moisture to lean meats by using water, broth, cooking wines, and other nonfat or low-fat sauces along with the cooking methods mentioned  above. Soups and stews should be chilled after cooking. The fat that forms on top after a few hours in the refrigerator should be skimmed off. When preparing meals, avoid using excess salt. Salt can contribute to raising blood pressure in some people.  EATING AWAY FROM HOME Order entres, potatoes, and vegetables without sauces or butter. When meat exceeds the size of a deck of cards (3 to 4 ounces), the rest can be taken home for another meal. Choose vegetable or fruit salads and ask for low-calorie salad dressings to be served on the side. Use dressings sparingly. Limit high-fat toppings, such as bacon, crumbled eggs, cheese, sunflower seeds, and olives. Ask for heart-healthy tub margarine instead of butter.

## 2018-02-15 NOTE — Progress Notes (Signed)
ON RECALL  °

## 2018-02-20 NOTE — Telephone Encounter (Signed)
PA has been approved

## 2018-03-13 ENCOUNTER — Other Ambulatory Visit: Payer: Self-pay

## 2018-03-13 ENCOUNTER — Encounter (HOSPITAL_COMMUNITY): Payer: Self-pay

## 2018-03-13 ENCOUNTER — Ambulatory Visit (HOSPITAL_COMMUNITY)
Admission: RE | Admit: 2018-03-13 | Discharge: 2018-03-13 | Disposition: A | Discharge status: Home / Self Care | Payer: 59 | Source: Ambulatory Visit | Attending: Gastroenterology | Admitting: Gastroenterology

## 2018-03-13 ENCOUNTER — Encounter (HOSPITAL_COMMUNITY)
Admission: RE | Disposition: A | Discharge status: Home / Self Care | Payer: Self-pay | Source: Ambulatory Visit | Attending: Gastroenterology

## 2018-03-13 DIAGNOSIS — K2990 Gastroduodenitis, unspecified, without bleeding: Secondary | ICD-10-CM

## 2018-03-13 DIAGNOSIS — K449 Diaphragmatic hernia without obstruction or gangrene: Secondary | ICD-10-CM | POA: Diagnosis not present

## 2018-03-13 DIAGNOSIS — R1013 Epigastric pain: Secondary | ICD-10-CM

## 2018-03-13 DIAGNOSIS — K317 Polyp of stomach and duodenum: Secondary | ICD-10-CM | POA: Diagnosis not present

## 2018-03-13 DIAGNOSIS — Z91030 Bee allergy status: Secondary | ICD-10-CM | POA: Insufficient documentation

## 2018-03-13 DIAGNOSIS — K2980 Duodenitis without bleeding: Secondary | ICD-10-CM | POA: Diagnosis not present

## 2018-03-13 DIAGNOSIS — F988 Other specified behavioral and emotional disorders with onset usually occurring in childhood and adolescence: Secondary | ICD-10-CM | POA: Diagnosis not present

## 2018-03-13 DIAGNOSIS — Z87891 Personal history of nicotine dependence: Secondary | ICD-10-CM | POA: Insufficient documentation

## 2018-03-13 DIAGNOSIS — R1310 Dysphagia, unspecified: Secondary | ICD-10-CM | POA: Diagnosis not present

## 2018-03-13 DIAGNOSIS — K3189 Other diseases of stomach and duodenum: Secondary | ICD-10-CM | POA: Diagnosis not present

## 2018-03-13 DIAGNOSIS — K2970 Gastritis, unspecified, without bleeding: Secondary | ICD-10-CM

## 2018-03-13 DIAGNOSIS — Z882 Allergy status to sulfonamides status: Secondary | ICD-10-CM | POA: Diagnosis not present

## 2018-03-13 DIAGNOSIS — K228 Other specified diseases of esophagus: Secondary | ICD-10-CM | POA: Diagnosis not present

## 2018-03-13 HISTORY — PX: SAVORY DILATION: SHX5439

## 2018-03-13 HISTORY — PX: ESOPHAGOGASTRODUODENOSCOPY: SHX5428

## 2018-03-13 SURGERY — EGD (ESOPHAGOGASTRODUODENOSCOPY)
Anesthesia: Moderate Sedation

## 2018-03-13 MED ORDER — SODIUM CHLORIDE 0.9 % IV SOLN
INTRAVENOUS | Status: DC
  Administered 2018-03-13: 14:00:00 via INTRAVENOUS

## 2018-03-13 MED ORDER — MIDAZOLAM HCL 5 MG/5ML IJ SOLN
INTRAMUSCULAR | Status: DC | PRN
  Administered 2018-03-13 (×2): 1 mg via INTRAVENOUS
  Administered 2018-03-13 (×3): 2 mg via INTRAVENOUS

## 2018-03-13 MED ORDER — MEPERIDINE HCL 100 MG/ML IJ SOLN
INTRAMUSCULAR | Status: DC | PRN
  Administered 2018-03-13 (×3): 25 mg via INTRAVENOUS

## 2018-03-13 MED ORDER — LIDOCAINE VISCOUS HCL 2 % MT SOLN
OROMUCOSAL | Status: AC
  Filled 2018-03-13: qty 15

## 2018-03-13 MED ORDER — MINERAL OIL PO OIL
TOPICAL_OIL | ORAL | Status: AC
  Filled 2018-03-13: qty 30

## 2018-03-13 MED ORDER — MEPERIDINE HCL 100 MG/ML IJ SOLN
INTRAMUSCULAR | Status: AC
  Filled 2018-03-13: qty 2

## 2018-03-13 MED ORDER — MIDAZOLAM HCL 5 MG/5ML IJ SOLN
INTRAMUSCULAR | Status: AC
  Filled 2018-03-13: qty 10

## 2018-03-13 NOTE — Op Note (Signed)
[facility] Patient Name: Sarah Blanchard Procedure Date: 03/13/2018 2:31 PM MRN: [id number] Date of Birth: Mar 10, 1991 Attending MD: Barney Drain MD, MD CSN: [id number] Age: 27 Admit Type: Outpatient Procedure:                Upper GI endoscopy WITH ESOPHAGEAL DILATION/COLD                            FORCEPS BIOPSY Indications:              Dyspepsia, Dysphagia Providers:                Barney Drain MD, MD, Rosina Lowenstein, RN, Nelma Rothman,                            Technician Referring MD:             Rosemary Holms, MD Medicines:                Meperidine 75 mg IV, Midazolam 8 mg IV Complications:            No immediate complications. Estimated Blood Loss:     Estimated blood loss was minimal. Procedure:                Pre-Anesthesia Assessment:                           - Prior to the procedure, a History and Physical                            was performed, and patient medications and                            allergies were reviewed. The patient's tolerance of                            previous anesthesia was also reviewed. The risks                            and benefits of the procedure and the sedation                            options and risks were discussed with the patient.                            All questions were answered, and informed consent                            was obtained. Prior Anticoagulants: The patient has                            taken no previous anticoagulant or antiplatelet                            agents. ASA Grade Assessment: I - A normal, healthy  patient. After reviewing the risks and benefits,                            the patient was deemed in satisfactory condition to                            undergo the procedure. After obtaining informed                            consent, the endoscope was passed under direct                            vision. Throughout the procedure, the patient's              blood pressure, pulse, and oxygen saturations were                            monitored continuously. The GIF-H190 ([id number])                            scope was introduced through the mouth, and                            advanced to the second part of duodenum. The upper                            GI endoscopy was somewhat difficult due to the                            patient's agitation. Successful completion of the                            procedure was aided by increasing the dose of                            sedation medication. The patient tolerated the                            procedure fairly well. Scope In: 3:00:01 PM Scope Out: 3:17:08 PM Total Procedure Duration: 0 hours 17 minutes 7 seconds  Findings:      No endoscopic abnormality was evident in the esophagus to explain the       patient's complaint of dysphagia. It was decided, however, to proceed       with dilation of the entire esophagus DUE TO POSSIBLE PROXIMAL       ESOPAHGEAL WEB. A guidewire was placed and the scope was withdrawn.       Dilation was performed with a Savary dilator with mild resistance at 16       mm and moderate resistance at 17 mm. Biopsies were obtained from the       proximal(20 CM FROM THE INCISORS) and distal(30 CM FROM THE INCISORS)       esophagus with cold forceps for histology of suspected eosinophilic       esophagitis. EGJ 35 CM FRM THE INCISORS.  A small hiatal hernia was present.      Patchy mild inflammation characterized by congestion (edema) and       erythema was found in the gastric antrum. Biopsies were taken with a       cold forceps for Helicobacter pylori testing OR EGE.(2: BULB, 3: ANTRUM).      A few small sessile polyps with no stigmata of recent bleeding were       found in the gastric body. The polyp was removed with a cold biopsy       forceps. Resection and retrieval were complete.      Patchy mild inflammation characterized by congestion (edema)  and       erythema was found in the duodenal bulb. Biopsies(2) for histology were       taken with a cold forceps for evaluation of celiac EGE.      The second portion of the duodenum was normal. Biopsies(4) for histology       were taken with a cold forceps for evaluation of EGE. Impression:               - No endoscopic esophageal abnormality to explain                            patient's dysphagia. Esophagus dilated. Dilated.                            Biopsied.                           - Small hiatal hernia.                           - MILD Gastritis/DUODENITIS. Biopsied.                           - A few gastric polyps. Resected and retrieved.                           . Moderate Sedation:      Moderate (conscious) sedation was administered by the endoscopy nurse       and supervised by the endoscopist. The following parameters were       monitored: oxygen saturation, heart rate, blood pressure, and response       to care. Total physician intraservice time was 33 minutes. Recommendation:           - Patient has a contact number available for                            emergencies. The signs and symptoms of potential                            delayed complications were discussed with the                            patient. Return to normal activities tomorrow.                            Written discharge instructions were provided to the  patient.                           - Low fat diet.                           - Continue present medications.                           - Await pathology results.                           - Return to my office in 4 months. Procedure Code(s):        --- Professional ---                           [phone], Esophagogastroduodenoscopy, flexible,                            transoral; with insertion of guide wire followed by                            passage of dilator(s) through esophagus over guide                             wire                           [id number], Esophagogastroduodenoscopy, flexible,                            transoral; with biopsy, single or multiple                           G0500, Moderate sedation services provided by the                            same physician or other qualified health care                            professional performing a gastrointestinal                            endoscopic service that sedation supports,                            requiring the presence of an independent trained                            observer to assist in the monitoring of the                            patient's level of consciousness and physiological                            status; initial 15 minutes of intra-service time;  patient age 71 years or older (additional time may                            be reported with [phone], as appropriate)                           [phone], Moderate sedation services provided by the                            same physician or other qualified health care                            professional performing the diagnostic or                            therapeutic service that the sedation supports,                            requiring the presence of an independent trained                            observer to assist in the monitoring of the                            patient's level of consciousness and physiological                            status; each additional 15 minutes intraservice                            time (List separately in addition to code for                            primary service) Diagnosis Code(s):        --- Professional ---                           R13.10, Dysphagia, unspecified                           K44.9, Diaphragmatic hernia without obstruction or                            gangrene                           K29.70, Gastritis, unspecified, without bleeding                           K31.7, Polyp  of stomach and duodenum                           K29.80, Duodenitis without bleeding                           R10.13, Epigastric pain CPT copyright 2017  American Medical Association. All rights reserved. The codes documented in this report are preliminary and upon coder review may  be revised to meet current compliance requirements. Barney Drain, MD Barney Drain MD, MD 03/13/2018 3:37:37 PM This report has been signed electronically. Number of Addenda: 0

## 2018-03-13 NOTE — Discharge Instructions (Signed)
I STRETCHED your esophagus BUT I DID NOT SEE A DEFINITE STRICTURE. You have BENIGN APPEARING STOMACH POLYPS AND a small hiatal hernia. You have gastritis & DUODENITIS. I biopsied your ESOPHAGUS, stomach, AND SMALL BOWEL.   DRINK WATER TO KEEP YOUR URINE LIGHT YELLOW.  CONTINUE YOUR WEIGHT LOSS EFFORTS. A WEIGHT OF  180 LB OR LESS  WILL GET YOUR BODY MASS INDEX(BMI) UNDER 30.   FOLLOW A LOW FAT DIET. MEATS SHOULD BE BAKED, BROILED, OR BOILED. AVOID FRIED FOODS. SEE INFO BELOW.  CONTINUE PROTONIX. TAKE 30 MINUTES PRIOR TO MEALS TWICE DAILY UNTIL OCT 3 THEN REDUCE TO ONCE DAILY.    YOUR BIOPSY WILL BE BACK IN 7 DAYS  FOLLOW UP APPT IN NOV 2019.    UPPER ENDOSCOPY AFTER CARE Read the instructions outlined below and refer to this sheet in the next week. These discharge instructions provide you with general information on caring for yourself after you leave the hospital. While your treatment has been planned according to the most current medical practices available, unavoidable complications occasionally occur. If you have any problems or questions after discharge, call DR. Raechelle Sarti, [phone].  ACTIVITY  You may resume your regular activity, but move at a slower pace for the next 24 hours.   Take frequent rest periods for the next 24 hours.   Walking will help get rid of the air and reduce the bloated feeling in your belly (abdomen).   No driving for 24 hours (because of the medicine (anesthesia) used during the test).   You may shower.   Do not sign any important legal documents or operate any machinery for 24 hours (because of the anesthesia used during the test).    NUTRITION  Drink plenty of fluids.   You may resume your normal diet as instructed by your doctor.   Begin with a light meal and progress to your normal diet. Heavy or fried foods are harder to digest and may make you feel sick to your stomach (nauseated).   Avoid alcoholic beverages for 24 hours or as  instructed.    MEDICATIONS  You may resume your normal medications.   WHAT YOU CAN EXPECT TODAY  Some feelings of bloating in the abdomen.   Passage of more gas than usual.    IF YOU HAD A BIOPSY TAKEN DURING THE UPPER ENDOSCOPY:  Eat a soft diet IF YOU HAVE NAUSEA, BLOATING, ABDOMINAL PAIN, OR VOMITING.    FINDING OUT THE RESULTS OF YOUR TEST Not all test results are available during your visit. DR. Oneida Alar WILL CALL YOU WITHIN 14 DAYS OF YOUR PROCEDUE WITH YOUR RESULTS. Do not assume everything is normal if you have not heard from DR. Tisa Weisel, CALL HER OFFICE AT [phone].  SEEK IMMEDIATE MEDICAL ATTENTION AND CALL THE OFFICE: [phone] IF:  You have more than a spotting of blood in your stool.   Your belly is swollen (abdominal distention).   You are nauseated or vomiting.   You have a temperature over 101F.   You have abdominal pain or discomfort that is severe or gets worse throughout the day.   Gastritis/DUODENITIS  Gastritis is an inflammation (the body's way of reacting to injury and/or infection) of the stomach. DUODENITIS is an inflammation (the body's way of reacting to injury and/or infection) of the FIRST PART OF THE SMALL INTESTINES. It is often caused by bacterial (germ) infections. It can also be caused BY ASPIRIN, BC/GOODY POWDER'S, (IBUPROFEN) MOTRIN, OR ALEVE (NAPROXEN), chemicals (including alcohol), SPICY FOODS, and  medications. This illness may be associated with generalized malaise (feeling tired, not well), UPPER ABDOMINAL STOMACH cramps, and fever. One common bacterial cause of gastritis is an organism known as H. Pylori. This can be treated with antibiotics.   Hiatal Hernia A hiatal hernia occurs when a part of the stomach slides above the diaphragm. The diaphragm is the thin muscle separating the belly (abdomen) from the chest. A hiatal hernia can be something you are born with or develop over time. Hiatal hernias may allow stomach acid to flow  back into your esophagus, the tube which carries food from your mouth to your stomach. If this acid causes problems it is called GERD (gastro-esophageal reflux disease).   SYMPTOMS Common symptoms of GERD are heartburn (burning in your chest). This is worse when lying down or bending over. It may also cause belching and indigestion. Some of the things which make GERD worse are:  Increased weight pushes on stomach making acid rise more easily.   Alcohol decreases lower esophageal sphincter pressure (valve between stomach and esophagus), allowing acid from stomach into esophagus.   Late evening meals and going to bed with a full stomach increases pressure.   HOME CARE INSTRUCTIONS  Try to achieve and maintain an ideal body weight.   Avoid drinking alcoholic beverages.   Do not wear tight clothing around your chest or stomach.   Eat smaller meals and eat more frequently. This keeps your stomach from getting too full. Eat slowly.   Do not lie down for 2 or 3 hours after eating. Do not eat or drink anything 1 to 2 hours before going to bed.   Avoid caffeine beverages (colas, coffee, cocoa, tea), fatty foods, citrus fruits and all other foods and drinks that contain acid and that seem to increase the problems.   Avoid bending over, especially after eating OR STRAINING. Anything that increases the pressure in your belly increases the amount of acid that may be pushed up into your esophagus.    ESOPHAGEAL STRICTURE  Esophageal strictures can be caused by stomach acid backing up into the tube that carries food from the mouth down to the stomach (lower esophagus).  TREATMENT There are a number of medicines used to treat reflux/stricture, including: Antacids.  ZANTAC OR PEPCID Proton-pump inhibitors: PROTONIX  HOME CARE INSTRUCTIONS Eat 2-3 hours before going to bed.  Try to reach and maintain a healthy weight.  Do not eat just a few very large meals. Instead, eat 4 TO 6 smaller meals  throughout the day.  Try to identify foods and beverages that make your symptoms worse, and avoid these.  Avoid tight clothing.  Do not exercise right after eating.

## 2018-03-13 NOTE — Interval H&P Note (Signed)
History and Physical Interval Note:  03/13/2018 2:40 PM  Sarah Blanchard  has presented today for surgery, with the diagnosis of dysphagia, dyspepesia  The various methods of treatment have been discussed with the patient and family. After consideration of risks, benefits and other options for treatment, the patient has consented to  Procedure(s) with comments: ESOPHAGOGASTRODUODENOSCOPY (EGD) (N/A) - 2:30pm SAVORY DILATION (N/A) as a surgical intervention .  The patient's history has been reviewed, patient examined, no change in status, stable for surgery.  I have reviewed the patient's chart and labs.  Questions were answered to the patient's satisfaction.     [employer]

## 2018-03-14 ENCOUNTER — Telehealth: Payer: Self-pay

## 2018-03-14 MED ORDER — LIDOCAINE VISCOUS HCL 2 % MT SOLN: OROMUCOSAL | 0 refills | Status: DC

## 2018-03-14 MED ORDER — HYDROCODONE-ACETAMINOPHEN 5-325 MG PO TABS: ORAL_TABLET | ORAL | 0 refills | Status: DC

## 2018-03-14 NOTE — Addendum Note (Signed)
Addended by: Danie Binder on: 03/14/2018 03:55 PM   Modules accepted: Orders

## 2018-03-14 NOTE — Telephone Encounter (Signed)
Sent pager to Dr. Oneida Alar also.

## 2018-03-14 NOTE — Telephone Encounter (Signed)
Noted  

## 2018-03-14 NOTE — Telephone Encounter (Signed)
Pt called and said she had an EGD with dilation yesterday by Dr. Oneida Alar. She hurts bad in her throat and in her esophagus and can hardly eat anything or drink anything.  She is breathing OK. Dr. Oneida Alar, please advise!

## 2018-03-14 NOTE — Telephone Encounter (Signed)
Called patient TO DISCUSS CONCERNS. PAIN with swallowing after EGD/DIL. Dull ache otherwise.   PLAN: 1. FULL LIQUID DIET FOR 3 DAYS. 2. CALL ON AUG 1 WITH AN UPDATE AND IF NEEDS WORK EXCUSE. 3. VISCOUS LIDOCAINE 10 CC VIA SYRINGE/PO Q4-6 H PRIR TO MEALS TO REDUCE ODYNOPHAGIA. 4. HYDROCODONE 1/2-1 PO Q4-6 PRN THROAT PAIN.  [facility] [place] IF PAIN GETS WORSE. WILL NEED GASTROGRAFFIN SWALLOWING STUDY. PT VOICED HER UNDERSTANDING.

## 2018-03-15 ENCOUNTER — Encounter (HOSPITAL_COMMUNITY): Payer: Self-pay | Admitting: Gastroenterology

## 2018-03-16 NOTE — Progress Notes (Signed)
PT is aware.

## 2018-03-23 NOTE — Progress Notes (Signed)
PATIENT SCHEDULED  °

## 2018-05-16 DIAGNOSIS — S93601A Unspecified sprain of right foot, initial encounter: Secondary | ICD-10-CM | POA: Diagnosis not present

## 2018-06-22 ENCOUNTER — Ambulatory Visit: Payer: 59 | Admitting: Gastroenterology

## 2018-06-22 ENCOUNTER — Encounter: Payer: Self-pay | Admitting: Gastroenterology

## 2018-06-22 DIAGNOSIS — R1310 Dysphagia, unspecified: Secondary | ICD-10-CM

## 2018-06-22 DIAGNOSIS — K5900 Constipation, unspecified: Secondary | ICD-10-CM | POA: Insufficient documentation

## 2018-06-22 DIAGNOSIS — K5901 Slow transit constipation: Secondary | ICD-10-CM

## 2018-06-22 DIAGNOSIS — K219 Gastro-esophageal reflux disease without esophagitis: Secondary | ICD-10-CM | POA: Diagnosis not present

## 2018-06-22 NOTE — Progress Notes (Signed)
cc'ed to pcp °

## 2018-06-22 NOTE — Assessment & Plan Note (Signed)
SYMPTOMS NOT IDEALLY CONTROLLED.  DRINK WATER TO KEEP YOUR URINE LIGHT YELLOW.  FOLLOW A HIGH FIBER DIET. Marland KitchenAVOID ITEMS THAT CAUSE BLOATING & GAS. SEE INFO BELOW.  USE FIBER POWDER 1 PACKET ONCE or TWICE DAILY TO YOUR WATER TO REDUCE CONSTIPATION. CONTINUE PROTONIX. TAKE 30 MINUTES PRIOR TO MEALS ONCE OR TWICE DAILY. USE LOWEST MOST EFFECTIVE DOSE. FOLLOW UP IN 6-12 MOS.   PLEASE CALL WITH QUESTIONS OR CONCERNS.

## 2018-06-22 NOTE — Progress Notes (Signed)
ON RECALL  °

## 2018-06-22 NOTE — Assessment & Plan Note (Signed)
SYMPTOMS CONTROLLED/RESOLVED.  DRINK WATER TO KEEP YOUR URINE LIGHT YELLOW. AVOID TRIGGERS. CONTINUE PROTONIX. TAKE 30 MINUTES PRIOR TO MEALS ONCE OR TWICE DAILY. USE LOWEST MOST EFFECTIVE DOSE. FOLLOW UP IN 6-12 MOS.  PLEASE CALL WITH QUESTIONS OR CONCERNS.

## 2018-06-22 NOTE — Progress Notes (Signed)
   Subjective:    Patient ID: Sarah Blanchard, female    DOB: 28-Jul-1991, 28 y.o.   MRN: [id number] Mikey Kirschner, MD  HPI GERD IS BETTER. STOPPED SMOKING 1 YEAR AGO. BREAK Agar HEARTBURN: < 1X/WEEK. NO NOCTURNAL SYMPTOMS. BMs: OK. MAY GO FOR DAYS AND THEN WON;T GO AND THEN GOES. MAY HAVE DISCOMFORT @ [employer] AND FEELS BLOATED. HER MOM'S THE SAME. TRYING TO UP WATER INTAKE AND WITH MORE WATER HAS MORE REGULAR BMs.    PT DENIES FEVER, CHILLS, HEMATOCHEZIA, HEMATEMESIS, nausea, vomiting, melena, diarrhea, CHEST PAIN, SHORTNESS OF BREATH,  CHANGE IN BOWEL IN HABITS, problems swallowing, OR heartburn or indigestion.  Past Medical History:  Diagnosis Date  . ADD (attention deficit disorder)   . Migraines   . Thyroid disease 2004   hypothyrodism   Past Surgical History:  Procedure Laterality Date  . ESOPHAGOGASTRODUODENOSCOPY N/A 03/13/2018   Procedure: ESOPHAGOGASTRODUODENOSCOPY (EGD);  Surgeon: Danie Binder, MD;  [facility];  Service: Endoscopy;  Laterality: N/A;  2:30pm  . SAVORY DILATION N/A 03/13/2018   Procedure: SAVORY DILATION;  Surgeon: Danie Binder, MD;  Location: [facility];  Service: Endoscopy;  Laterality: N/A;  . THYROIDECTOMY, PARTIAL  2004  . WISDOM TOOTH EXTRACTION     [place] 10 years ago   Allergies  Allergen Reactions  . Bee Venom Anaphylaxis  . Sulfa Antibiotics     Migraines, facial swelling    Current Outpatient Medications  Medication Sig    . ibuprofen (ADVIL,MOTRIN) 200 MG tablet Take 400 mg by mouth daily as needed for headache or moderate pain.    Marland Kitchen lidocaine (XYLOCAINE) 2 % solution 2 TSP  PO Q4-6H PRN FOR CHEST PAIN OR THROAT PAIN    . pantoprazole (PROTONIX) 40 MG tablet 1 PO 30 MINUTES PRIOR TO MEALS BID FOR 3 MOS THEN QD (Patient taking differently: Take 40 mg by mouth 2 (two) times daily. )    . HYDROcodone-acetaminophen (NORCO/VICODIN) 5-325 MG tablet 1/2 to 1 po EVERY 4-6 H PRN FOR THROAT PAIN (Patient not taking: Reported  on 06/22/2018)     Review of Systems PER HPI OTHERWISE ALL SYSTEMS ARE NEGATIVE.    Objective:   Physical Exam        Assessment & Plan:

## 2018-06-22 NOTE — Assessment & Plan Note (Signed)
SYMPTOMS CONTROLLED/RESOLVED.  CONTINUE TO MONITOR SYMPTOMS. 

## 2018-06-22 NOTE — Patient Instructions (Addendum)
DRINK WATER TO KEEP YOUR URINE LIGHT YELLOW.  FOLLOW A HIGH FIBER DIET. Marland KitchenAVOID ITEMS THAT CAUSE BLOATING & GAS. SEE INFO BELOW.  USE FIBER POWDER 1 PACKET ONCE or TWICE DAILY TO YOUR WATER TO REDUCE CONSTIPATION.  CONTINUE PROTONIX. TAKE 30 MINUTES PRIOR TO MEALS ONCE OR TWICE DAILY. USE LOWEST MOST EFFECTIVE DOSE.  FOLLOW UP IN 6-12 MOS.   PLEASE CALL WITH QUESTIONS OR CONCERNS.  High-Fiber Diet A high-fiber diet changes your normal diet to include more whole grains, legumes, fruits, and vegetables. Changes in the diet involve replacing refined carbohydrates with unrefined foods. The calorie level of the diet is essentially unchanged. The Dietary Reference Intake (recommended amount) for adult males is 38 grams per day. For adult females, it is 25 grams per day. Pregnant and lactating women should consume 28 grams of fiber per day. Fiber is the intact part of a plant that is not broken down during digestion. Functional fiber is fiber that has been isolated from the plant to provide a beneficial effect in the body.   PURPOSE  Increase stool bulk.   Ease and regulate bowel movements.   Lower cholesterol.   REDUCE RISK OF COLON CANCER  INDICATIONS THAT YOU NEED MORE FIBER  Constipation and hemorrhoids.   Uncomplicated diverticulosis (intestine condition) and irritable bowel syndrome.   Weight management.   As a protective measure against hardening of the arteries (atherosclerosis), diabetes, and cancer.   GUIDELINES FOR INCREASING FIBER IN THE DIET  Start adding fiber to the diet slowly. A gradual increase of about 5 more grams (2 slices of whole-wheat bread, 2 servings of most fruits or vegetables, or 1 bowl of high-fiber cereal) per day is best. Too rapid an increase in fiber may result in constipation, flatulence, and bloating.   Drink enough water and fluids to keep your urine clear or pale yellow. Water, juice, or caffeine-free drinks are recommended. Not drinking enough  fluid may cause constipation.   Eat a variety of high-fiber foods rather than one type of fiber.   Try to increase your intake of fiber through using high-fiber foods rather than fiber pills or supplements that contain small amounts of fiber.   The goal is to change the types of food eaten. Do not supplement your present diet with high-fiber foods, but replace foods in your present diet.  INCLUDE A VARIETY OF FIBER SOURCES  Replace refined and processed grains with whole grains, canned fruits with fresh fruits, and incorporate other fiber sources. White rice, white breads, and most bakery goods contain little or no fiber.   Brown whole-grain rice, buckwheat oats, and many fruits and vegetables are all good sources of fiber. These include: broccoli, Brussels sprouts, cabbage, cauliflower, beets, sweet potatoes, white potatoes (skin on), carrots, tomatoes, eggplant, squash, berries, fresh fruits, and dried fruits.   Cereals appear to be the richest source of fiber. Cereal fiber is found in whole grains and bran. Bran is the fiber-rich outer coat of cereal grain, which is largely removed in refining. In whole-grain cereals, the bran remains. In breakfast cereals, the largest amount of fiber is found in those with "bran" in their names. The fiber content is sometimes indicated on the label.   You may need to include additional fruits and vegetables each day.   In baking, for 1 cup white flour, you may use the following substitutions:   1 cup whole-wheat flour minus 2 tablespoons.   1/2 cup white flour plus 1/2 cup whole-wheat flour.

## 2018-07-25 ENCOUNTER — Encounter: Payer: Self-pay | Admitting: Family Medicine

## 2018-07-25 ENCOUNTER — Ambulatory Visit (INDEPENDENT_AMBULATORY_CARE_PROVIDER_SITE_OTHER): Payer: 59 | Admitting: Family Medicine

## 2018-07-25 VITALS — BP 118/78 | Temp 98.6°F | Ht 64.0 in | Wt 195.8 lb

## 2018-07-25 DIAGNOSIS — J329 Chronic sinusitis, unspecified: Secondary | ICD-10-CM

## 2018-07-25 MED ORDER — CEFDINIR 300 MG PO CAPS: 300.0000 mg | ORAL_CAPSULE | Freq: Two times a day (BID) | ORAL | 0 refills | Status: DC

## 2018-07-25 NOTE — Progress Notes (Signed)
   Subjective:    Patient ID: Sarah Blanchard, female    DOB: 06/14/1991, 27 y.o.   MRN: [id number]  Cough  This is a new problem. The current episode started in the past 7 days. Associated symptoms include ear pain, headaches, a sore throat and wheezing. Treatments tried: dayquil and nyquil.   Felt throat got inflammed right off the bat, trouble swallowing and sig pain  stillwas hurtincough and ocng and wheezing    No obv fever   fet a bit low grade     Ear pain bilat   Right nt gland  Pos tnder   Trying otc meds, thr dayquil Greenish nasal disch     Pos nasal cong and frontal        Review of Systems  HENT: Positive for ear pain and sore throat.   Respiratory: Positive for cough and wheezing.   Neurological: Positive for headaches.       Objective:   Physical Exam  Alert, mild malaise. Hydration good Vitals stable. frontal/ maxillary tenderness evident positive nasal congestion. pharynx normal neck supple  lungs clear/no crackles or wheezes. heart regular in rhythm       Assessment & Plan:  Impression rhinosinusitis likely post viral, discussed with patient. plan antibiotics prescribed. Questions answered. Symptomatic care discussed. warning signs discussed. WSL

## 2018-08-24 DIAGNOSIS — L409 Psoriasis, unspecified: Secondary | ICD-10-CM | POA: Diagnosis not present

## 2018-08-24 DIAGNOSIS — Z9089 Acquired absence of other organs: Secondary | ICD-10-CM | POA: Diagnosis not present

## 2018-08-24 DIAGNOSIS — K219 Gastro-esophageal reflux disease without esophagitis: Secondary | ICD-10-CM | POA: Diagnosis not present

## 2018-09-12 DIAGNOSIS — Z6832 Body mass index (BMI) 32.0-32.9, adult: Secondary | ICD-10-CM | POA: Diagnosis not present

## 2018-09-12 DIAGNOSIS — Z9889 Other specified postprocedural states: Secondary | ICD-10-CM | POA: Diagnosis not present

## 2018-09-12 DIAGNOSIS — Z01419 Encounter for gynecological examination (general) (routine) without abnormal findings: Secondary | ICD-10-CM | POA: Diagnosis not present

## 2018-12-20 ENCOUNTER — Encounter: Payer: Self-pay | Admitting: Gastroenterology

## 2019-03-05 DIAGNOSIS — T8332XA Displacement of intrauterine contraceptive device, initial encounter: Secondary | ICD-10-CM | POA: Diagnosis not present

## 2019-03-05 DIAGNOSIS — Z30432 Encounter for removal of intrauterine contraceptive device: Secondary | ICD-10-CM | POA: Diagnosis not present

## 2019-03-05 DIAGNOSIS — R1032 Left lower quadrant pain: Secondary | ICD-10-CM | POA: Diagnosis not present

## 2019-05-24 ENCOUNTER — Encounter (HOSPITAL_COMMUNITY): Payer: Self-pay | Admitting: Emergency Medicine

## 2019-05-24 ENCOUNTER — Other Ambulatory Visit: Payer: Self-pay

## 2019-05-24 ENCOUNTER — Emergency Department (HOSPITAL_COMMUNITY)
Admission: EM | Admit: 2019-05-24 | Discharge: 2019-05-24 | Disposition: A | Discharge status: Home / Self Care | Payer: 59 | Attending: Emergency Medicine | Admitting: Emergency Medicine

## 2019-05-24 DIAGNOSIS — L500 Allergic urticaria: Secondary | ICD-10-CM | POA: Insufficient documentation

## 2019-05-24 DIAGNOSIS — R21 Rash and other nonspecific skin eruption: Secondary | ICD-10-CM | POA: Diagnosis present

## 2019-05-24 DIAGNOSIS — L509 Urticaria, unspecified: Secondary | ICD-10-CM

## 2019-05-24 DIAGNOSIS — Z87891 Personal history of nicotine dependence: Secondary | ICD-10-CM | POA: Diagnosis not present

## 2019-05-24 DIAGNOSIS — Z79899 Other long term (current) drug therapy: Secondary | ICD-10-CM | POA: Insufficient documentation

## 2019-05-24 MED ORDER — PREDNISONE 50 MG PO TABS: 50.0000 mg | ORAL_TABLET | Freq: Every day | ORAL | 0 refills | Status: DC

## 2019-05-24 MED ORDER — EPINEPHRINE 0.3 MG/0.3ML IJ SOSY: 0.3000 mg | PREFILLED_SYRINGE | Freq: Once | INTRAMUSCULAR | 0 refills | Status: DC | PRN

## 2019-05-24 MED ORDER — DOXEPIN HCL 10 MG PO CAPS: 10.0000 mg | ORAL_CAPSULE | Freq: Three times a day (TID) | ORAL | 0 refills | Status: DC

## 2019-05-24 MED ORDER — DOXEPIN HCL 10 MG PO CAPS
10.0000 mg | ORAL_CAPSULE | Freq: Once | ORAL | Status: AC
  Administered 2019-05-24: 06:00:00 10 mg via ORAL
  Filled 2019-05-24 (×2): qty 1

## 2019-05-24 MED ORDER — METHYLPREDNISOLONE SODIUM SUCC 125 MG IJ SOLR
125.0000 mg | Freq: Once | INTRAMUSCULAR | Status: AC
  Administered 2019-05-24: 05:00:00 125 mg via INTRAVENOUS
  Filled 2019-05-24: qty 2

## 2019-05-24 MED ORDER — DIPHENHYDRAMINE HCL 50 MG/ML IJ SOLN
25.0000 mg | Freq: Once | INTRAMUSCULAR | Status: AC
  Administered 2019-05-24: 25 mg via INTRAVENOUS
  Filled 2019-05-24: qty 1

## 2019-05-24 MED ORDER — EPINEPHRINE 0.3 MG/0.3ML IJ SOAJ
0.3000 mg | Freq: Once | INTRAMUSCULAR | Status: AC
  Administered 2019-05-24: 0.3 mg via INTRAMUSCULAR
  Filled 2019-05-24: qty 0.3

## 2019-05-24 NOTE — ED Triage Notes (Signed)
Pt arrives via POV w/complaint of unknown allergic reaction.  Pt noticed hives from head to toe around 0400. Pt took 50mg  of benadryl at 0430 w/out relief. Pt also complains of scratchy throat.

## 2019-05-24 NOTE — ED Provider Notes (Signed)
Blood pressure 118/61, pulse 79, temperature 98.7 F (37.1 C), temperature source Oral, resp. rate 15, height 5\' 5"  (1.651 m), weight 72.6 kg, last menstrual period 04/30/2019, SpO2 100 %.  Assuming care from Dr. Roxanne Mins.  In short, Sarah Blanchard is a 28 y.o. female with a chief complaint of Allergic Reaction .  Refer to the original H&P for additional details.  The current plan of care is to f/u after ED obs period.  07:20 AM  Called the patient bedside she is asking to be discharged.  She states she is feeling much better and symptoms have almost completely resolved.  He states she has to go to work later.  She has prescriptions sent to the pharmacy.  Plans to pick those up and to return with any new or suddenly worsening symptoms.  Patient appears safe and stable for discharge.     Margette Fast, MD 05/24/19 [phone]

## 2019-05-24 NOTE — Discharge Instructions (Signed)
Continue to take diphenhydramine and famotidine as needed.

## 2019-05-24 NOTE — ED Provider Notes (Signed)
[facility] EMERGENCY DEPARTMENT Provider Note   CSN: [id number] Arrival date & time: 05/24/19  0453    History   Chief Complaint Chief Complaint  Patient presents with  . Allergic Reaction    HPI Sarah Blanchard is a 28 y.o. female.   The history is provided by the patient.  She has history of attention deficit disorder, migraines, GERD and comes in with an allergic reaction.  She woke up with generalized itching and noted rash on her legs, trunk, face.  She is having very slight difficulty swallowing but no difficulty breathing.  She has a history of allergies to bees and had EpiPen in the past, but she no longer has it at home.  She did take diphenhydramine 50 mg and famotidine 40 mg at home without any benefit.  Past Medical History:  Diagnosis Date  . ADD (attention deficit disorder)   . Migraines   . Thyroid disease 2004   hypothyrodism    Patient Active Problem List   Diagnosis Date Noted  . Constipation 06/22/2018  . Dysphagia, idiopathic 02/15/2018  . GERD (gastroesophageal reflux disease) 02/15/2018  . Dyspepsia 02/15/2018  . ADHD (attention deficit hyperactivity disorder) 05/14/2013  . FX CLOSED FIBULA NOS 06/09/2009    Past Surgical History:  Procedure Laterality Date  . ESOPHAGOGASTRODUODENOSCOPY N/A 03/13/2018   Procedure: ESOPHAGOGASTRODUODENOSCOPY (EGD);  Surgeon: Danie Binder, MD;  [facility];  Service: Endoscopy;  Laterality: N/A;  2:30pm  . SAVORY DILATION N/A 03/13/2018   Procedure: SAVORY DILATION;  Surgeon: Danie Binder, MD;  Location: [facility];  Service: Endoscopy;  Laterality: N/A;  . THYROIDECTOMY, PARTIAL  2004  . WISDOM TOOTH EXTRACTION     [place] 10 years ago     OB History   No obstetric history on file.      Home Medications    Prior to Admission medications   Medication Sig Start Date End Date Taking? Authorizing Provider  cefdinir (OMNICEF) 300 MG capsule Take 1 capsule (300 mg total) by mouth 2 (two)  times daily. 07/25/18   Mikey Kirschner, MD  HYDROcodone-acetaminophen (NORCO/VICODIN) 5-325 MG tablet 1/2 to 1 po EVERY 4-6 H PRN FOR THROAT PAIN Patient not taking: Reported on 06/22/2018 03/14/18   Danie Binder, MD  ibuprofen (ADVIL,MOTRIN) 200 MG tablet Take 400 mg by mouth daily as needed for headache or moderate pain.    [provider]  lidocaine (XYLOCAINE) 2 % solution 2 TSP  PO Q4-6H PRN FOR CHEST PAIN OR THROAT PAIN 03/14/18   Fields, Marga Melnick, MD  pantoprazole (PROTONIX) 40 MG tablet 1 PO 30 MINUTES PRIOR TO MEALS BID FOR 3 MOS THEN QD Patient taking differently: Take 40 mg by mouth 2 (two) times daily.  02/15/18   Danie Binder, MD    Family History Family History  Problem Relation Age of Onset  . Heart disease Other   . Colon cancer Neg Hx   . Colon polyps Neg Hx     Social History Social History   Tobacco Use  . Smoking status: Former Research scientist (life sciences)  . Smokeless tobacco: Never Used  Substance Use Topics  . Alcohol use: Yes    Alcohol/week: 0.0 standard drinks    Comment: occ  . Drug use: Never     Allergies   Bee venom and Sulfa antibiotics   Review of Systems Review of Systems  All other systems reviewed and are negative.    Physical Exam Updated Vital Signs BP  134/70 (BP Location: Right Arm)   Pulse 100   Temp 98.7 F (37.1 C) (Oral)   Resp (!) 21   Ht 5\' 5"  (1.651 m)   Wt 72.6 kg   SpO2 95%   BMI 26.63 kg/m   Physical Exam Vitals signs and nursing note reviewed.    28 year old female, resting comfortably and in no acute distress. Vital signs are normal. Oxygen saturation is 95%, which is normal. Head is normocephalic and atraumatic. PERRLA, EOMI. Oropharynx is clear.  There is no sublingual edema and no edema of the uvula.  There is no stridor. Neck is nontender and supple without adenopathy or JVD. Back is nontender and there is no CVA tenderness. Lungs are clear without rales, wheezes, or rhonchi. Chest is nontender. Heart has  regular rate and rhythm without murmur. Abdomen is soft, flat, nontender without masses or hepatosplenomegaly and peristalsis is normoactive. Extremities have no cyanosis or edema, full range of motion is present. Skin is warm and dry.  Diffuse urticarial rash is present. Neurologic: Mental status is normal, cranial nerves are intact, there are no motor or sensory deficits.  ED Treatments / Results   Procedures Procedures  CRITICAL CARE Performed by: Delora Fuel Total critical care time: 40 minutes Critical care time was exclusive of separately billable procedures and treating other patients. Critical care was necessary to treat or prevent imminent or life-threatening deterioration. Critical care was time spent personally by me on the following activities: development of treatment plan with patient and/or surrogate as well as nursing, discussions with consultants, evaluation of patient's response to treatment, examination of patient, obtaining history from patient or surrogate, ordering and performing treatments and interventions, ordering and review of laboratory studies, ordering and review of radiographic studies, pulse oximetry and re-evaluation of patient's condition.  Medications Ordered in ED Medications  EPINEPHrine (EPI-PEN) injection 0.3 mg (has no administration in time range)  diphenhydrAMINE (BENADRYL) injection 25 mg (has no administration in time range)  methylPREDNISolone sodium succinate (SOLU-MEDROL) 125 mg/2 mL injection 125 mg (has no administration in time range)  doxepin (SINEQUAN) capsule 10 mg (has no administration in time range)     Initial Impression / Assessment and Plan / ED Course  I have reviewed the triage vital signs and the nursing notes.  Acute allergic reaction.  She is already failed to respond to diphenhydramine and famotidine.  She is given an injection of epinephrine and given intravenous diphenhydramine and methylprednisolone, oral doxepin.  Old  records are reviewed, and she has no relevant past visits.  6:34 AM She had good symptomatic relief with above-noted treatment.  She will need to be observed for some additional time in the ED.  Case is signed out to Dr. Laverta Baltimore.  When cleared, she will be discharged with prescriptions for prednisone, doxepin, and epinephrine autoinjector.  Final Clinical Impressions(s) / ED Diagnoses   Final diagnoses:  Urticaria    ED Discharge Orders    None       Delora Fuel, MD [id number] [phone]

## 2019-10-26 DIAGNOSIS — M25512 Pain in left shoulder: Secondary | ICD-10-CM | POA: Diagnosis not present

## 2019-10-26 DIAGNOSIS — M25511 Pain in right shoulder: Secondary | ICD-10-CM | POA: Diagnosis not present

## 2019-11-13 DIAGNOSIS — M25511 Pain in right shoulder: Secondary | ICD-10-CM | POA: Diagnosis not present

## 2019-11-13 DIAGNOSIS — M25512 Pain in left shoulder: Secondary | ICD-10-CM | POA: Diagnosis not present

## 2020-01-25 DIAGNOSIS — M79641 Pain in right hand: Secondary | ICD-10-CM | POA: Diagnosis not present

## 2020-01-25 DIAGNOSIS — S62649D Nondisplaced fracture of proximal phalanx of unspecified finger, subsequent encounter for fracture with routine healing: Secondary | ICD-10-CM | POA: Diagnosis not present

## 2020-02-13 DIAGNOSIS — M79644 Pain in right finger(s): Secondary | ICD-10-CM | POA: Diagnosis not present

## 2020-03-11 DIAGNOSIS — M79644 Pain in right finger(s): Secondary | ICD-10-CM | POA: Diagnosis not present

## 2020-03-13 DIAGNOSIS — J069 Acute upper respiratory infection, unspecified: Secondary | ICD-10-CM | POA: Diagnosis not present

## 2020-08-16 NOTE — L&D Delivery Note (Signed)
Operative Delivery Note At 7:26 AM a viable female was delivered via .  Presentation: vertex; Position: Left,, Occiput,, Anterior; Station: +2.  Verbal consent: obtained from patient.  Risks and benefits discussed in detail.  Risks include, but are not limited to the risks of anesthesia, bleeding, infection, damage to maternal tissues, fetal cephalhematoma.  There is also the risk of inability to effect vaginal delivery of the head, or shoulder dystocia that cannot be resolved by established maneuvers, leading to the need for emergency cesarean section.  Foley was in place.  Adequate anesthesia with epidural.  Position of fetal head noted to be LOA.  Initially the Bell vacuum was placed due to concern for non-reassuring fetal well being with variable decels with slow return of baseline with FHR noted to be in the 90s.  One pull with pop off was noted with some descent noted.  FHT were then noted to be 120bpm with moderate variability and + accels.  She was then able to push on her own for several contractions.  The Bell was noted to have difficulty with maintaining suction and was transitioned to the [facility].  At +4 station, to help expedite delivery due to maternal exhaustion and Cat. 2 tracing, the Kiwi was applied.  One pull followed by delivery of fetal head.    APGAR: 8,3, 9; weight: 2305g  .   Placenta status: to pathology .   Cord:  with the following complications: none.  Cord pH: n/a  Anesthesia: epidural Episiotomy:  none Lacerations:  1st degree Suture Repair: 3.0 vicryl Est. Blood Loss (mL):    Mom to postpartum.  Baby to Couplet care / Skin to Skin.  Sarah Blanchard 03/27/2021, 8:00 AM

## 2020-08-29 ENCOUNTER — Ambulatory Visit (INDEPENDENT_AMBULATORY_CARE_PROVIDER_SITE_OTHER): Payer: Self-pay | Admitting: Adult Health

## 2020-08-29 ENCOUNTER — Other Ambulatory Visit: Payer: Self-pay

## 2020-08-29 ENCOUNTER — Encounter: Payer: 59 | Admitting: Medical

## 2020-08-29 ENCOUNTER — Encounter: Payer: Self-pay | Admitting: Adult Health

## 2020-08-29 VITALS — BP 105/71 | HR 72 | Ht 65.0 in | Wt 165.0 lb

## 2020-08-29 DIAGNOSIS — Z3A01 Less than 8 weeks gestation of pregnancy: Secondary | ICD-10-CM | POA: Insufficient documentation

## 2020-08-29 DIAGNOSIS — Z3201 Encounter for pregnancy test, result positive: Secondary | ICD-10-CM

## 2020-08-29 DIAGNOSIS — O26851 Spotting complicating pregnancy, first trimester: Secondary | ICD-10-CM

## 2020-08-29 DIAGNOSIS — O3680X0 Pregnancy with inconclusive fetal viability, not applicable or unspecified: Secondary | ICD-10-CM

## 2020-08-29 LAB — POCT URINE PREGNANCY: Preg Test, Ur: POSITIVE — AB

## 2020-08-29 NOTE — Patient Instructions (Signed)
Obstetrics: Normal and Problem Pregnancies (7th ed., pp. 102-121). [place], [place]: Elsevier."> Textbook of Family Medicine (9th ed., pp. [phone]). [place], [place]: [employer].">  First Trimester of Pregnancy  The first trimester of pregnancy starts on the first day of your last menstrual period until the end of week 12. This is months 1 through 3 of pregnancy. A week after a sperm fertilizes an egg, the egg will implant into the wall of the uterus and begin to develop into a baby. By the end of 12 weeks, all the baby's organs will be formed and the baby will be 2-3 inches in size. Body changes during your first trimester Your body goes through many changes during pregnancy. The changes vary and generally return to normal after your baby is born. Physical changes  You may gain or lose weight.  Your breasts may begin to grow larger and become tender. The tissue that surrounds your nipples (areola) may become darker.  Dark spots or blotches (chloasma or mask of pregnancy) may develop on your face.  You may have changes in your hair. These can include thickening or thinning of your hair or changes in texture. Health changes  You may feel nauseous, and you may vomit.  You may have heartburn.  You may develop headaches.  You may develop constipation.  Your gums may bleed and may be sensitive to brushing and flossing. Other changes  You may tire easily.  You may urinate more often.  Your menstrual periods will stop.  You may have a loss of appetite.  You may develop cravings for certain kinds of food.  You may have changes in your emotions from day to day.  You may have more vivid and strange dreams. Follow these instructions at home: Medicines  Follow your health care provider's instructions regarding medicine use. Specific medicines may be either safe or unsafe to take during pregnancy. Do not take any medicines unless told to by your health care provider.  Take a  prenatal vitamin that contains at least 600 micrograms (mcg) of folic acid. Eating and drinking  Eat a healthy diet that includes fresh fruits and vegetables, whole grains, good sources of protein such as meat, eggs, or tofu, and low-fat dairy products.  Avoid raw meat and unpasteurized juice, milk, and cheese. These carry germs that can harm you and your baby.  If you feel nauseous or you vomit: ? Eat 4 or 5 small meals a day instead of 3 large meals. ? Try eating a few soda crackers. ? Drink liquids between meals instead of during meals.  You may need to take these actions to prevent or treat constipation: ? Drink enough fluid to keep your urine pale yellow. ? Eat foods that are high in fiber, such as beans, whole grains, and fresh fruits and vegetables. ? Limit foods that are high in fat and processed sugars, such as fried or sweet foods. Activity  Exercise only as directed by your health care provider. Most people can continue their usual exercise routine during pregnancy. Try to exercise for 30 minutes at least 5 days a week.  Stop exercising if you develop pain or cramping in the lower abdomen or lower back.  Avoid exercising if it is very hot or humid or if you are at high altitude.  Avoid heavy lifting.  If you choose to, you may have sex unless your health care provider tells you not to. Relieving pain and discomfort  Wear a good support bra to relieve breast  tenderness.  Rest with your legs elevated if you have leg cramps or low back pain.  If you develop bulging veins (varicose veins) in your legs: ? Wear support hose as told by your health care provider. ? Elevate your feet for 15 minutes, 3-4 times a day. ? Limit salt in your diet. Safety  Wear your seat belt at all times when driving or riding in a car.  Talk with your health care provider if someone is verbally or physically abusive to you.  Talk with your health care provider if you are feeling sad or have  thoughts of hurting yourself. Lifestyle  Do not use hot tubs, steam rooms, or saunas.  Do not douche. Do not use tampons or scented sanitary pads.  Do not use herbal remedies, alcohol, illegal drugs, or medicines that are not approved by your health care provider. Chemicals in these products can harm your baby.  Do not use any products that contain nicotine or tobacco, such as cigarettes, e-cigarettes, and chewing tobacco. If you need help quitting, ask your health care provider.  Avoid cat litter boxes and soil used by cats. These carry germs that can cause birth defects in the baby and possibly loss of the unborn baby (fetus) by miscarriage or stillbirth. General instructions  During routine prenatal visits in the first trimester, your health care provider will do a physical exam, perform necessary tests, and ask you how things are going. Keep all follow-up visits. This is important.  Ask for help if you have counseling or nutritional needs during pregnancy. Your health care provider can offer advice or refer you to specialists for help with various needs.  Schedule a dentist appointment. At home, brush your teeth with a soft toothbrush. Floss gently.  Write down your questions. Take them to your prenatal visits. Where to find more information  American Pregnancy Association: americanpregnancy.[place] and Gynecologists: PoolDevices.com.pt  Office on [employer]: KeywordPortfolios.com.br Contact a health care provider if you have:  Dizziness.  A fever.  Mild pelvic cramps, pelvic pressure, or nagging pain in the abdominal area.  Nausea, vomiting, or diarrhea that lasts for 24 hours or longer.  A bad-smelling vaginal discharge.  Pain when you urinate.  Known exposure to a contagious illness, such as chickenpox, measles, Zika virus, HIV, or hepatitis. Get help right away if you have:  Spotting or bleeding from your  vagina.  Severe abdominal cramping or pain.  Shortness of breath or chest pain.  Any kind of trauma, such as from a fall or a car crash.  New or increased pain, swelling, or redness in an arm or leg. Summary  The first trimester of pregnancy starts on the first day of your last menstrual period until the end of week 12 (months 1 through 3).  Eating 4 or 5 small meals a day rather than 3 large meals may help to relieve nausea and vomiting.  Do not use any products that contain nicotine or tobacco, such as cigarettes, e-cigarettes, and chewing tobacco. If you need help quitting, ask your health care provider.  Keep all follow-up visits. This is important. This information is not intended to replace advice given to you by your health care provider. Make sure you discuss any questions you have with your health care provider. Document Revised: 01/09/2020 Document Reviewed: 11/15/2019 Elsevier Patient Education  2021 [employer].

## 2020-08-29 NOTE — Progress Notes (Addendum)
  Subjective:     Patient ID: Sarah Blanchard, female   DOB: November 21, 1990, 30 y.o.   MRN: [id number]  HPI Kelissa is a 30 year old white female, in for UPT, has missed a period and had breast tenderness, got pregnant on the pill Laren 24 Fe. PCP is Dr Nevada Crane.    Review of Systems Has missed a period and had 3+HPTs, first 08/15/20, stopped OCs 08/14/20 +breast tenderness +heartburn,TUMS not helping, can try prilosec  Spotting pink Reviewed past medical,surgical, social and family history. Reviewed medications and allergies.     Objective:   Physical Exam BP 105/71 (BP Location: Left Arm, Patient Position: Sitting, Cuff Size: Normal)   Pulse 72   Ht 5\' 5"  (1.651 m)   Wt 165 lb (74.8 kg)   LMP 07/10/2020   BMI 27.46 kg/m UPT +, about 7+1 week by LMP with EDD 04/16/21. Skin warm and dry. Neck: mid line trachea, normal thyroid, good ROM, no lymphadenopathy noted. Lungs: clear to ausculation bilaterally. Cardiovascular: regular rate and rhythm.Abdomen is soft and non tender. AA is 0 Fall risk is low PHQ 9 score is 3 GAD 7 score is 0  [facility] - 08/29/20 1007      Pregnancy Intention Screening   Does the patient want to become pregnant in the next year? N/A    Does the patient's partner want to become pregnant in the next year? N/A    Would the patient like to discuss contraceptive options today? N/A      Contraception Wrap Up   Current Method Pregnant/Seeking Pregnancy    End Method Pregnant/Seeking Pregnancy    Contraception Counseling Provided No             Assessment:     1. Pregnancy examination or test, positive result -continue OTC PNV  2. Less than [redacted] weeks gestation of pregnancy   3. Encounter to determine fetal viability of pregnancy, single or unspecified fetus Return in 1 week for dating [place]  4. Spotting affecting pregnancy in first trimester No sex till no spotting for 7 days     Plan:     Review handout on First trimester and by [facility]

## 2020-08-30 LAB — TSH: TSH: 1.09 u[IU]/mL (ref 0.450–4.500)

## 2020-08-30 LAB — BETA HCG QUANT (REF LAB): hCG Quant: 162110 m[IU]/mL

## 2020-09-02 ENCOUNTER — Encounter: Payer: 59 | Admitting: Adult Health

## 2020-09-11 ENCOUNTER — Ambulatory Visit (INDEPENDENT_AMBULATORY_CARE_PROVIDER_SITE_OTHER): Payer: Medicaid Other

## 2020-09-11 ENCOUNTER — Other Ambulatory Visit: Payer: Self-pay

## 2020-09-11 DIAGNOSIS — O3680X0 Pregnancy with inconclusive fetal viability, not applicable or unspecified: Secondary | ICD-10-CM | POA: Diagnosis not present

## 2020-09-11 DIAGNOSIS — Z3A09 9 weeks gestation of pregnancy: Secondary | ICD-10-CM | POA: Diagnosis not present

## 2020-09-11 NOTE — Progress Notes (Signed)
[place] 9 wks,single IUP,CRL 19.36 mm,FHR 178 BPM,normal ovaries

## 2020-10-08 ENCOUNTER — Other Ambulatory Visit: Payer: Self-pay | Admitting: Obstetrics & Gynecology

## 2020-10-08 DIAGNOSIS — Z3682 Encounter for antenatal screening for nuchal translucency: Secondary | ICD-10-CM

## 2020-10-09 ENCOUNTER — Encounter: Payer: Self-pay | Admitting: Advanced Practice Midwife

## 2020-10-09 ENCOUNTER — Other Ambulatory Visit: Payer: Self-pay

## 2020-10-09 ENCOUNTER — Ambulatory Visit (INDEPENDENT_AMBULATORY_CARE_PROVIDER_SITE_OTHER): Payer: Medicaid Other | Admitting: Advanced Practice Midwife

## 2020-10-09 ENCOUNTER — Other Ambulatory Visit (HOSPITAL_COMMUNITY)
Admission: RE | Admit: 2020-10-09 | Discharge: 2020-10-09 | Disposition: A | Discharge status: Home / Self Care | Payer: Medicaid Other | Source: Ambulatory Visit | Attending: Advanced Practice Midwife | Admitting: Advanced Practice Midwife

## 2020-10-09 ENCOUNTER — Ambulatory Visit (INDEPENDENT_AMBULATORY_CARE_PROVIDER_SITE_OTHER): Payer: Medicaid Other

## 2020-10-09 ENCOUNTER — Ambulatory Visit: Payer: Medicaid Other | Admitting: *Deleted

## 2020-10-09 VITALS — BP 104/70 | HR 71 | Wt 166.0 lb

## 2020-10-09 DIAGNOSIS — Z3682 Encounter for antenatal screening for nuchal translucency: Secondary | ICD-10-CM

## 2020-10-09 DIAGNOSIS — Z3401 Encounter for supervision of normal first pregnancy, first trimester: Secondary | ICD-10-CM

## 2020-10-09 DIAGNOSIS — Z3A13 13 weeks gestation of pregnancy: Secondary | ICD-10-CM

## 2020-10-09 DIAGNOSIS — Z3400 Encounter for supervision of normal first pregnancy, unspecified trimester: Secondary | ICD-10-CM | POA: Insufficient documentation

## 2020-10-09 DIAGNOSIS — Z3402 Encounter for supervision of normal first pregnancy, second trimester: Secondary | ICD-10-CM

## 2020-10-09 LAB — POCT URINALYSIS DIPSTICK OB
Blood, UA: NEGATIVE
Glucose, UA: NEGATIVE
Ketones, UA: NEGATIVE
Leukocytes, UA: NEGATIVE
Nitrite, UA: NEGATIVE
POC,PROTEIN,UA: NEGATIVE

## 2020-10-09 NOTE — Patient Instructions (Signed)
Emeri L Sellman, I greatly value your feedback.  If you receive a survey following your visit with [place] today, we appreciate you taking the time to fill it out.  Thanks, Nigel Berthold, DNP, [place]!!! It is now [place] at [facility] ([place], [place] [address]) Entrance located off of [address] parking   Nausea & Vomiting  Have saltine crackers or pretzels by your bed and eat a few bites before you raise your head out of bed in the morning  Eat small frequent meals throughout the day instead of large meals  Drink plenty of fluids throughout the day to stay hydrated, just don't drink a lot of fluids with your meals.  This can make your stomach fill up faster making you feel sick  Do not brush your teeth right after you eat  Products with real ginger are good for nausea, like ginger ale and ginger hard candy Make sure it says made with real ginger!  Sucking on sour candy like lemon heads is also good for nausea  If your prenatal vitamins make you nauseated, take them at night so you will sleep through the nausea  Sea Bands  If you feel like you need medicine for the nausea & vomiting please let us know  If you are unable to keep any fluids or food down please let us know   Constipation  Drink plenty of fluid, preferably water, throughout the day  Eat foods high in fiber such as fruits, vegetables, and grains  Exercise, such as walking, is a good way to keep your bowels regular  Drink warm fluids, especially warm prune juice, or decaf coffee  Eat a 1/2 cup of real oatmeal (not instant), 1/2 cup applesauce, and 1/2-1 cup warm prune juice every day  If needed, you may take Colace (docusate sodium) stool softener once or twice a day to help keep the stool soft.   If you still are having problems with constipation, you may take Miralax once daily as needed to help keep your bowels regular.    Home Blood Pressure Monitoring for Patients   Your provider has recommended that you check your blood pressure (BP) at least once a week at home. If you do not have a blood pressure cuff at home, one will be provided for you. Contact your provider if you have not received your monitor within 1 week.   Helpful Tips for Accurate Home Blood Pressure Checks  . Don't smoke, exercise, or drink caffeine 30 minutes before checking your BP . Use the restroom before checking your BP (a full bladder can raise your pressure) . Relax in a comfortable upright chair . Feet on the ground . Left arm resting comfortably on a flat surface at the level of your heart . Legs uncrossed . Back supported . Sit quietly and don't talk . Place the cuff on your bare arm . Adjust snuggly, so that only two fingertips can fit between your skin and the top of the cuff . Check 2 readings separated by at least one minute . Keep a log of your BP readings . For a visual, please reference this diagram: http://ccnc.care/bpdiagram  Provider Name: [facility]     Phone: [phone]  Zone 1: ALL CLEAR  Continue to monitor your symptoms:  . BP reading is less than 140 (top number) or less than 90 (bottom number)  . No right upper stomach  pain . No headaches or seeing spots . No feeling nauseated or throwing up . No swelling in face and hands  Zone 2: CAUTION Call your doctor's office for any of the following:  . BP reading is greater than 140 (top number) or greater than 90 (bottom number)  . Stomach pain under your ribs in the middle or right side . Headaches or seeing spots . Feeling nauseated or throwing up . Swelling in face and hands  Zone 3: EMERGENCY  Seek immediate medical care if you have any of the following:  . BP reading is greater than160 (top number) or greater than 110 (bottom number) . Severe headaches not improving with Tylenol . Serious difficulty catching your breath . Any worsening  symptoms from Zone 2    First Trimester of Pregnancy The first trimester of pregnancy is from week 1 until the end of week 12 (months 1 through 3). A week after a sperm fertilizes an egg, the egg will implant on the wall of the uterus. This embryo will begin to develop into a baby. Genes from you and your partner are forming the baby. The female genes determine whether the baby is a boy or a girl. At 6-8 weeks, the eyes and face are formed, and the heartbeat can be seen on ultrasound. At the end of 12 weeks, all the baby's organs are formed.  Now that you are pregnant, you will want to do everything you can to have a healthy baby. Two of the most important things are to get good prenatal care and to follow your health care provider's instructions. Prenatal care is all the medical care you receive before the baby's birth. This care will help prevent, find, and treat any problems during the pregnancy and childbirth. BODY CHANGES Your body goes through many changes during pregnancy. The changes vary from woman to woman.   You may gain or lose a couple of pounds at first.  You may feel sick to your stomach (nauseous) and throw up (vomit). If the vomiting is uncontrollable, call your health care provider.  You may tire easily.  You may develop headaches that can be relieved by medicines approved by your health care provider.  You may urinate more often. Painful urination may mean you have a bladder infection.  You may develop heartburn as a result of your pregnancy.  You may develop constipation because certain hormones are causing the muscles that push waste through your intestines to slow down.  You may develop hemorrhoids or swollen, bulging veins (varicose veins).  Your breasts may begin to grow larger and become tender. Your nipples may stick out more, and the tissue that surrounds them (areola) may become darker.  Your gums may bleed and may be sensitive to brushing and flossing.  Dark  spots or blotches (chloasma, mask of pregnancy) may develop on your face. This will likely fade after the baby is born.  Your menstrual periods will stop.  You may have a loss of appetite.  You may develop cravings for certain kinds of food.  You may have changes in your emotions from day to day, such as being excited to be pregnant or being concerned that something may go wrong with the pregnancy and baby.  You may have more vivid and strange dreams.  You may have changes in your hair. These can include thickening of your hair, rapid growth, and changes in texture. Some women also have hair loss during or after pregnancy, or hair that  feels dry or thin. Your hair will most likely return to normal after your baby is born. WHAT TO EXPECT AT YOUR PRENATAL VISITS During a routine prenatal visit:  You will be weighed to make sure you and the baby are growing normally.  Your blood pressure will be taken.  Your abdomen will be measured to track your baby's growth.  The fetal heartbeat will be listened to starting around week 10 or 12 of your pregnancy.  Test results from any previous visits will be discussed. Your health care provider may ask you:  How you are feeling.  If you are feeling the baby move.  If you have had any abnormal symptoms, such as leaking fluid, bleeding, severe headaches, or abdominal cramping.  If you have any questions. Other tests that may be performed during your first trimester include:  Blood tests to find your blood type and to check for the presence of any previous infections. They will also be used to check for low iron levels (anemia) and Rh antibodies. Later in the pregnancy, blood tests for diabetes will be done along with other tests if problems develop.  Urine tests to check for infections, diabetes, or protein in the urine.  An ultrasound to confirm the proper growth and development of the baby.  An amniocentesis to check for possible genetic  problems.  Fetal screens for spina bifida and Down syndrome.  You may need other tests to make sure you and the baby are doing well. HOME CARE INSTRUCTIONS  Medicines  Follow your health care provider's instructions regarding medicine use. Specific medicines may be either safe or unsafe to take during pregnancy.  Take your prenatal vitamins as directed.  If you develop constipation, try taking a stool softener if your health care provider approves. Diet  Eat regular, well-balanced meals. Choose a variety of foods, such as meat or vegetable-based protein, fish, milk and low-fat dairy products, vegetables, fruits, and whole grain breads and cereals. Your health care provider will help you determine the amount of weight gain that is right for you.  Avoid raw meat and uncooked cheese. These carry germs that can cause birth defects in the baby.  Eating four or five small meals rather than three large meals a day may help relieve nausea and vomiting. If you start to feel nauseous, eating a few soda crackers can be helpful. Drinking liquids between meals instead of during meals also seems to help nausea and vomiting.  If you develop constipation, eat more high-fiber foods, such as fresh vegetables or fruit and whole grains. Drink enough fluids to keep your urine clear or pale yellow. Activity and Exercise  Exercise only as directed by your health care provider. Exercising will help you:  Control your weight.  Stay in shape.  Be prepared for labor and delivery.  Experiencing pain or cramping in the lower abdomen or low back is a good sign that you should stop exercising. Check with your health care provider before continuing normal exercises.  Try to avoid standing for long periods of time. Move your legs often if you must stand in one place for a long time.  Avoid heavy lifting.  Wear low-heeled shoes, and practice good posture.  You may continue to have sex unless your health care  provider directs you otherwise. Relief of Pain or Discomfort  Wear a good support bra for breast tenderness.    Take warm sitz baths to soothe any pain or discomfort caused by hemorrhoids. Use hemorrhoid cream  if your health care provider approves.    Rest with your legs elevated if you have leg cramps or low back pain.  If you develop varicose veins in your legs, wear support hose. Elevate your feet for 15 minutes, 3-4 times a day. Limit salt in your diet. Prenatal Care  Schedule your prenatal visits by the twelfth week of pregnancy. They are usually scheduled monthly at first, then more often in the last 2 months before delivery.  Write down your questions. Take them to your prenatal visits.  Keep all your prenatal visits as directed by your health care provider. Safety  Wear your seat belt at all times when driving.  Make a list of emergency phone numbers, including numbers for family, friends, the hospital, and police and fire departments. General Tips  Ask your health care provider for a referral to a local prenatal education class. Begin classes no later than at the beginning of month 6 of your pregnancy.  Ask for help if you have counseling or nutritional needs during pregnancy. Your health care provider can offer advice or refer you to specialists for help with various needs.  Do not use hot tubs, steam rooms, or saunas.  Do not douche or use tampons or scented sanitary pads.  Do not cross your legs for long periods of time.  Avoid cat litter boxes and soil used by cats. These carry germs that can cause birth defects in the baby and possibly loss of the fetus by miscarriage or stillbirth.  Avoid all smoking, herbs, alcohol, and medicines not prescribed by your health care provider. Chemicals in these affect the formation and growth of the baby.  Schedule a dentist appointment. At home, brush your teeth with a soft toothbrush and be gentle when you floss. SEEK MEDICAL  CARE IF:   You have dizziness.  You have mild pelvic cramps, pelvic pressure, or nagging pain in the abdominal area.  You have persistent nausea, vomiting, or diarrhea.  You have a bad smelling vaginal discharge.  You have pain with urination.  You notice increased swelling in your face, hands, legs, or ankles. SEEK IMMEDIATE MEDICAL CARE IF:   You have a fever.  You are leaking fluid from your vagina.  You have spotting or bleeding from your vagina.  You have severe abdominal cramping or pain.  You have rapid weight gain or loss.  You vomit blood or material that looks like coffee grounds.  You are exposed to [place] measles and have never had them.  You are exposed to fifth disease or chickenpox.  You develop a severe headache.  You have shortness of breath.  You have any kind of trauma, such as from a fall or a car accident. Document Released: 07/27/2001 Document Revised: 12/17/2013 Document Reviewed: 06/12/2013 [facility] Patient Information 2015 [place], [place]. This information is not intended to replace advice given to you by your health care provider. Make sure you discuss any questions you have with your health care provider.  Coronavirus (COVID-19) Are you at risk?  Are you at risk for the Coronavirus (COVID-19)?  To be considered HIGH RISK for Coronavirus (COVID-19), you have to meet the following criteria:  . Traveled to [place], [place], [place], [place] or [place];  and have fever, cough, and shortness of breath within the last 2 weeks of travel OR . Been in close contact with a person diagnosed with COVID-19 within the last 2 weeks and have fever, cough, and shortness of breath . IF YOU DO  NOT MEET THESE CRITERIA, YOU ARE CONSIDERED LOW RISK FOR COVID-19.  What to do if you are HIGH RISK for COVID-19?  Marland Kitchen If you are having a medical emergency, call 911. . Seek medical care right away. Before you go to a doctor's office, urgent care or emergency department,  call ahead and tell them about your recent travel, contact with someone diagnosed with COVID-19, and your symptoms. You should receive instructions from your physician's office regarding next steps of care.  . When you arrive at healthcare provider, tell the healthcare staff immediately you have returned from visiting [place], [place], [place], [place] or [place]; in the last two weeks or you have been in close contact with a person diagnosed with COVID-19 in the last 2 weeks.   . Tell the health care staff about your symptoms: fever, cough and shortness of breath. . After you have been seen by a medical provider, you will be either: o Tested for (COVID-19) and discharged home on quarantine except to seek medical care if symptoms worsen, and asked to  - Stay home and avoid contact with others until you get your results (4-5 days)  - Avoid travel on public transportation if possible (such as bus, train, or airplane) or o Sent to the Emergency Department by EMS for evaluation, COVID-19 testing, and possible admission depending on your condition and test results.  What to do if you are LOW RISK for COVID-19?  Reduce your risk of any infection by using the same precautions used for avoiding the common cold or flu:  Marland Kitchen Wash your hands often with soap and warm water for at least 20 seconds.  If soap and water are not readily available, use an alcohol-based hand sanitizer with at least 60% alcohol.  . If coughing or sneezing, cover your mouth and nose by coughing or sneezing into the elbow areas of your shirt or coat, into a tissue or into your sleeve (not your hands). . Avoid shaking hands with others and consider head nods or verbal greetings only. . Avoid touching your eyes, nose, or mouth with unwashed hands.  . Avoid close contact with people who are sick. . Avoid places or events with large numbers of people in one location, like concerts or sporting events. . Carefully consider travel plans you have or  are making. . If you are planning any travel outside or inside the [place], visit the CDC's Travelers' Health webpage for the latest health notices. . If you have some symptoms but not all symptoms, continue to monitor at home and seek medical attention if your symptoms worsen. . If you are having a medical emergency, call 911.   [place] / e-Visit: [email or web address]         [facility]: [facility]: [phone]                   [facility]: [phone]     Safe Medications in Pregnancy   Acne: Benzoyl Peroxide Salicylic Acid  Backache/Headache: Tylenol: 2 regular strength every 4 hours OR              2 Extra strength every 6 hours  Colds/Coughs/Allergies: Benadryl (alcohol free) 25 mg every 6 hours as needed Breath right strips Claritin Cepacol throat lozenges Chloraseptic throat spray Cold-Eeze- up to three times per day Cough drops, alcohol free Flonase (by prescription only) Guaifenesin Mucinex Robitussin DM (plain only, alcohol  free) Saline nasal spray/drops Sudafed (pseudoephedrine) & Actifed ** use only after [redacted] weeks gestation and if you do not have high blood pressure Tylenol Vicks Vaporub Zinc lozenges Zyrtec   Constipation: Colace Ducolax suppositories Fleet enema Glycerin suppositories Metamucil Milk of magnesia Miralax Senokot Smooth move tea  Diarrhea: Kaopectate Imodium A-D  *NO pepto Bismol  Hemorrhoids: Anusol Anusol HC Preparation H Tucks  Indigestion: Tums Maalox Mylanta Zantac  Pepcid  Insomnia: Benadryl (alcohol free) 25mg  every 6 hours as needed Tylenol PM Unisom, no Gelcaps  Leg Cramps: Tums MagGel  Nausea/Vomiting:  Bonine Dramamine Emetrol Ginger extract Sea bands Meclizine  Nausea medication to take during pregnancy:  Unisom (doxylamine succinate  25 mg tablets) Take one tablet daily at bedtime. If symptoms are not adequately controlled, the dose can be increased to a maximum recommended dose of two tablets daily (1/2 tablet in the morning, 1/2 tablet mid-afternoon and one at bedtime). Vitamin B6 100mg  tablets. Take one tablet twice a day (up to 200 mg per day).  Skin Rashes: Aveeno products Benadryl cream or 25mg  every 6 hours as needed Calamine Lotion 1% cortisone cream  Yeast infection: Gyne-lotrimin 7 Monistat 7   **If taking multiple medications, please check labels to avoid duplicating the same active ingredients **take medication as directed on the label ** Do not exceed 4000 mg of tylenol in 24 hours **Do not take medications that contain aspirin or ibuprofen

## 2020-10-09 NOTE — Progress Notes (Signed)
INITIAL OBSTETRICAL VISIT Patient name: GRAINNE KNIGHTS MRN [id number]  Date of birth: 05/24/1991 Chief Complaint:   Initial Prenatal Visit  History of Present Illness:   TEARSA KOWALEWSKI is a 30 y.o. G28P0 Caucasian female at [redacted]w[redacted]d by LMP c/w u/s at 8 weeks with an Estimated Date of Delivery: 04/16/21 being seen today for her initial obstetrical visit.   Her obstetrical history is significant for first baby.  Dx w/benign thyroid tumor age 37-13.  Was never on meds--had 1/2 of thyroid removed and it resolved. . TSH normal last month, Today she reports fatigue.  Depression screen [facility] 2/9 10/09/2020 08/29/2020  Decreased Interest 0 0  Down, Depressed, Hopeless 0 0  PHQ - 2 Score 0 0  Altered sleeping 0 0  Tired, decreased energy 0 3  Change in appetite 0 0  Feeling bad or failure about yourself  0 0  Trouble concentrating 0 0  Moving slowly or fidgety/restless 0 0  Suicidal thoughts 0 0  PHQ-9 Score 0 3    Patient's last menstrual period was 07/10/2020. Last pap 2016. Results were: normal Review of Systems:   Pertinent items are noted in HPI Denies cramping/contractions, leakage of fluid, vaginal bleeding, abnormal vaginal discharge w/ itching/odor/irritation, headaches, visual changes, shortness of breath, chest pain, abdominal pain, severe nausea/vomiting, or problems with urination or bowel movements unless otherwise stated above.  Pertinent History Reviewed:  Reviewed past medical,surgical, social, obstetrical and family history.  Reviewed problem list, medications and allergies. OB History  Gravida Para Term Preterm AB Living  1            SAB IAB Ectopic Multiple Live Births               # Outcome Date GA Lbr Len/2nd Weight Sex Delivery Anes PTL Lv  1 Current            Physical Assessment:   Vitals:   10/09/20 0945  BP: 104/70  Pulse: 71  Weight: 166 lb (75.3 kg)  Body mass index is 27.62 kg/m.       Physical Examination:  General appearance - well appearing,  and in no distress  Mental status - alert, oriented to person, place, and time  Psych:  She has a normal mood and affect  Skin - warm and dry, normal color, no suspicious lesions noted  Chest - effort normal  Heart - normal rate and regular rhythm  Abdomen - soft, nontender  Extremities:  No swelling or varicosities noted  Pelvic - VULVA: normal appearing vulva with no masses, tenderness or lesions  VAGINA: normal appearing vagina with normal color and discharge, no lesions  CERVIX: normal appearing cervix without discharge or lesions, no CMT  Thin prep pap is done w/ HR HPV cotesting  TODAY'S NT [place] 13 wks,measurements c/w dates,NT 1.3 mm,NB present,CRL 63.52 mm,normal ovaries,fhr 157 bpm  Results for orders placed or performed in visit on 10/09/20 (from the past 24 hour(s))  POC Urinalysis Dipstick OB   Collection Time: 10/09/20 10:27 AM  Result Value Ref Range   Color, UA     Clarity, UA     Glucose, UA Negative Negative   Bilirubin, UA     Ketones, UA neg    Spec Grav, UA     Blood, UA neg    pH, UA     POC,PROTEIN,UA Negative Negative, Trace, Small (1+), Moderate (2+), Large (3+), 4+   Urobilinogen, UA     Nitrite, UA neg  Leukocytes, UA Negative Negative   Appearance     Odor      Assessment & Plan:  1) Low-Risk Pregnancy G1P0 at [redacted]w[redacted]d with an Estimated Date of Delivery: 04/16/21   2) Initial OB visit  3) Hx thyroid tumor (benign), age 65; removed, now nl functioning thyroid.   Meds: No orders of the defined types were placed in this encounter.   Initial labs obtained Continue prenatal vitamins Reviewed n/v relief measures and warning s/s to report Reviewed recommended weight gain based on pre-gravid BMI Encouraged well-balanced diet Genetic & carrier screening discussed: requests Panorama, NT/IT and Horizon 14 , declines AFP Ultrasound discussed; fetal survey: requested [facility] completed> form faxed if has or is planning to apply for medicaid The nature of [place] for [employer] with multiple MDs and other Advanced Practice Providers was explained to patient; also emphasized that fellows, residents, and students are part of our team. Has home bp cuff. Check bp weekly, let us know if >140/90.        Joaquim Lai Cresenzo-Dishmon 10:45 AM

## 2020-10-09 NOTE — Progress Notes (Signed)
[place] 13 wks,measurements c/w dates,NT 1.3 mm,NB present,CRL 63.52 mm,normal ovaries,fhr 157 bpm

## 2020-10-10 LAB — MED LIST OPTION NOT SELECTED

## 2020-10-10 LAB — PMP SCREEN PROFILE (10S), URINE
Amphetamine Scrn, Ur: NEGATIVE ng/mL
BARBITURATE SCREEN URINE: NEGATIVE ng/mL
BENZODIAZEPINE SCREEN, URINE: NEGATIVE ng/mL
CANNABINOIDS UR QL SCN: NEGATIVE ng/mL
Cocaine (Metab) Scrn, Ur: NEGATIVE ng/mL
Creatinine(Crt), U: 69.4 mg/dL (ref 20.0–300.0)
Methadone Screen, Urine: NEGATIVE ng/mL
OXYCODONE+OXYMORPHONE UR QL SCN: NEGATIVE ng/mL
Opiate Scrn, Ur: NEGATIVE ng/mL
Ph of Urine: 6.7 (ref 4.5–8.9)
Phencyclidine Qn, Ur: NEGATIVE ng/mL
Propoxyphene Scrn, Ur: NEGATIVE ng/mL

## 2020-10-10 LAB — INTEGRATED 1

## 2020-10-11 LAB — CBC/D/PLT+RPR+RH+ABO+RUB AB...
Antibody Screen: NEGATIVE
Basophils Absolute: 0.1 10*3/uL (ref 0.0–0.2)
Basos: 1 %
EOS (ABSOLUTE): 0.1 10*3/uL (ref 0.0–0.4)
Eos: 1 %
HCV Ab: <0.1 s/co ratio (ref 0.0–0.9)
HIV Screen 4th Generation wRfx: NONREACTIVE
Hematocrit: 37.8 % (ref 34.0–46.6)
Hemoglobin: 13 g/dL (ref 11.1–15.9)
Hepatitis B Surface Ag: NEGATIVE
Immature Grans (Abs): 0 10*3/uL (ref 0.0–0.1)
Immature Granulocytes: 0 %
Lymphocytes Absolute: 2.2 10*3/uL (ref 0.7–3.1)
Lymphs: 29 %
MCH: 30.1 pg (ref 26.6–33.0)
MCHC: 34.4 g/dL (ref 31.5–35.7)
MCV: 88 fL (ref 79–97)
Monocytes Absolute: 0.4 10*3/uL (ref 0.1–0.9)
Monocytes: 5 %
Neutrophils Absolute: 4.9 10*3/uL (ref 1.4–7.0)
Neutrophils: 64 %
Platelets: 269 10*3/uL (ref 150–450)
RBC: 4.32 x10E6/uL (ref 3.77–5.28)
RDW: 11.8 % (ref 11.7–15.4)
RPR Ser Ql: NONREACTIVE
Rh Factor: POSITIVE
Rubella Antibodies, IGG: 1.76 index (ref >0.99)
WBC: 7.6 10*3/uL (ref 3.4–10.8)

## 2020-10-11 LAB — INTEGRATED 1
Gest. Age on Collection Date: 12.6 weeks
Maternal Age at EDD: 31.1 yr
Nuchal Translucency (NT): 1.3 mm
Number of Fetuses: 1
PAPP-A Value: 1393.4 ng/mL

## 2020-10-11 LAB — URINE CULTURE

## 2020-10-11 LAB — HCV INTERPRETATION

## 2020-10-13 LAB — CYTOLOGY - PAP
Adequacy: ABSENT
Chlamydia: NEGATIVE
Comment: NEGATIVE
Comment: NEGATIVE
Comment: NORMAL
Diagnosis: NEGATIVE
High risk HPV: NEGATIVE
Neisseria Gonorrhea: NEGATIVE

## 2020-10-30 ENCOUNTER — Telehealth: Payer: Medicaid Other | Admitting: Women's Health

## 2020-10-30 ENCOUNTER — Telehealth (INDEPENDENT_AMBULATORY_CARE_PROVIDER_SITE_OTHER): Payer: Medicaid Other | Admitting: Advanced Practice Midwife

## 2020-10-30 VITALS — Wt 172.3 lb

## 2020-10-30 DIAGNOSIS — Z3A16 16 weeks gestation of pregnancy: Secondary | ICD-10-CM

## 2020-10-30 DIAGNOSIS — Z3402 Encounter for supervision of normal first pregnancy, second trimester: Secondary | ICD-10-CM

## 2020-10-30 DIAGNOSIS — Z363 Encounter for antenatal screening for malformations: Secondary | ICD-10-CM

## 2020-10-30 MED ORDER — PANTOPRAZOLE SODIUM 20 MG PO TBEC: 20.0000 mg | DELAYED_RELEASE_TABLET | Freq: Every day | ORAL | 6 refills | Status: DC

## 2020-10-30 NOTE — Progress Notes (Signed)
   I connected with Sarah Blanchard 10/30/20 at  3:50 PM EDT by: MyChart video and verified that I am speaking with the correct person using two identifiers.  Patient is located at home and provider is located at [facility].     The purpose of this virtual visit is to provide medical care while limiting exposure to the novel coronavirus. I discussed the limitations, risks, security and privacy concerns of performing an evaluation and management service by MyChart video and the availability of in person appointments. I also discussed with the patient that there may be a patient responsible charge related to this service. By engaging in this virtual visit, you consent to the provision of healthcare.  Additionally, you authorize for your insurance to be billed for the services provided during this visit.  The patient expressed understanding and agreed to proceed.  The following staff members participated in the virtual visit:  Marcelino Scot, RN    TELE-PRENATAL VISIT NOTE  Subjective:  Sarah Blanchard is a 30 y.o. G1P0 at [redacted]w[redacted]d  for phone visit for ongoing prenatal care.  She is currently monitored for the following issues for this low-risk pregnancy and has FX CLOSED FIBULA NOS; ADHD (attention deficit hyperactivity disorder); Dysphagia, idiopathic; GERD (gastroesophageal reflux disease); Dyspepsia; and Supervision of normal first pregnancy on their problem list.  Patient reports heartburn (on Nexium, protonix worked better in the past) and some occipital HAs . Discussed posture, trigger points, ice/heat, massage..  Contractions: Not present. Vag. Bleeding: None.  Movement: Present. Denies leaking of fluid.   The following portions of the patient's history were reviewed and updated as appropriate: allergies, current medications, past family history, past medical history, past social history, past surgical history and problem list.   Objective:   Vitals:   10/30/20 1600  Weight: 172 lb 4.8 oz (78.2 kg)    Self-Obtained  Fetal Status:     Movement: Present     Assessment and Plan:  Pregnancy: G1P0 at [redacted]w[redacted]d 1. Encounter for supervision of normal first pregnancy in second trimester   2. [redacted] weeks gestation of pregnancy   3. Antenatal screening for malformation using ultrasonics  - US OB Comp + 14 Wk; Future  Preterm labor symptoms and general obstetric precautions including but not limited to vaginal bleeding, contractions, leaking of fluid and fetal movement were reviewed in detail with the patient.  Return in about 3 weeks (around 11/20/2020) for HT:DSKAJGO.  Future Appointments  Date Time Provider [place]  11/20/2020  9:00 AM [facility] [place] [facility] None  11/20/2020 10:10 AM Cresenzo-Dishmon, Joaquim Lai, CNM [facility]     Time spent on virtual visit: 15 minutes  Christin Fudge, CNM

## 2020-10-30 NOTE — Patient Instructions (Signed)
Sarah Blanchard, I greatly value your feedback.  If you receive a survey following your visit with us today, we appreciate you taking the time to fill it out.  Thanks, Fran Cresenzo-Dishmon, CNM     [facility] is now [facility] ([address] [place], [place] [address]) Entrance located off of [address] Free 24/7 valet parking   Go to [email or web address] to register for FREE online childbirth classes    Second Trimester of Pregnancy The second trimester is from week 14 through week 27 (months 4 through 6). The second trimester is often a time when you feel your best. Your body has adjusted to being pregnant, and you begin to feel better physically. Usually, morning sickness has lessened or quit completely, you may have more energy, and you may have an increase in appetite. The second trimester is also a time when the fetus is growing rapidly. At the end of the sixth month, the fetus is about 9 inches long and weighs about 1 pounds. You will likely begin to feel the baby move (quickening) between 16 and 20 weeks of pregnancy. Body changes during your second trimester Your body continues to go through many changes during your second trimester. The changes vary from woman to woman.  Your weight will continue to increase. You will notice your lower abdomen bulging out.  You may begin to get stretch marks on your hips, abdomen, and breasts.  You may develop headaches that can be relieved by medicines. The medicines should be approved by your health care provider.  You may urinate more often because the fetus is pressing on your bladder.  You may develop or continue to have heartburn as a result of your pregnancy.  You may develop constipation because certain hormones are causing the muscles that push waste through your intestines to slow down.  You may develop hemorrhoids or swollen, bulging veins (varicose veins).  You may have  back pain. This is caused by: ? Weight gain. ? Pregnancy hormones that are relaxing the joints in your pelvis. ? A shift in weight and the muscles that support your balance.  Your breasts will continue to grow and they will continue to become tender.  Your gums may bleed and may be sensitive to brushing and flossing.  Dark spots or blotches (chloasma, mask of pregnancy) may develop on your face. This will likely fade after the baby is born.  A dark line from your belly button to the pubic area (linea nigra) may appear. This will likely fade after the baby is born.  You may have changes in your hair. These can include thickening of your hair, rapid growth, and changes in texture. Some women also have hair loss during or after pregnancy, or hair that feels dry or thin. Your hair will most likely return to normal after your baby is born.  What to expect at prenatal visits During a routine prenatal visit:  You will be weighed to make sure you and the fetus are growing normally.  Your blood pressure will be taken.  Your abdomen will be measured to track your baby's growth.  The fetal heartbeat will be listened to.  Any test results from the previous visit will be discussed.  Your health care provider may ask you:  How you are feeling.  If you are feeling the baby move.  If you have had any abnormal symptoms, such as leaking fluid, bleeding, severe headaches, or   abdominal cramping.  If you are using any tobacco products, including cigarettes, chewing tobacco, and electronic cigarettes.  If you have any questions.  Other tests that may be performed during your second trimester include:  Blood tests that check for: ? Low iron levels (anemia). ? High blood sugar that affects pregnant women (gestational diabetes) between 24 and 28 weeks. ? Rh antibodies. This is to check for a protein on red blood cells (Rh factor).  Urine tests to check for infections, diabetes, or protein in  the urine.  An ultrasound to confirm the proper growth and development of the baby.  An amniocentesis to check for possible genetic problems.  Fetal screens for spina bifida and Down syndrome.  HIV (human immunodeficiency virus) testing. Routine prenatal testing includes screening for HIV, unless you choose not to have this test.  Follow these instructions at home: Medicines  Follow your health care provider's instructions regarding medicine use. Specific medicines may be either safe or unsafe to take during pregnancy.  Take a prenatal vitamin that contains at least 600 micrograms (mcg) of folic acid.  If you develop constipation, try taking a stool softener if your health care provider approves. Eating and drinking  Eat a balanced diet that includes fresh fruits and vegetables, whole grains, good sources of protein such as meat, eggs, or tofu, and low-fat dairy. Your health care provider will help you determine the amount of weight gain that is right for you.  Avoid raw meat and uncooked cheese. These carry germs that can cause birth defects in the baby.  If you have low calcium intake from food, talk to your health care provider about whether you should take a daily calcium supplement.  Limit foods that are high in fat and processed sugars, such as fried and sweet foods.  To prevent constipation: ? Drink enough fluid to keep your urine clear or pale yellow. ? Eat foods that are high in fiber, such as fresh fruits and vegetables, whole grains, and beans. Activity  Exercise only as directed by your health care provider. Most women can continue their usual exercise routine during pregnancy. Try to exercise for 30 minutes at least 5 days a week. Stop exercising if you experience uterine contractions.  Avoid heavy lifting, wear low heel shoes, and practice good posture.  A sexual relationship may be continued unless your health care provider directs you otherwise. Relieving pain  and discomfort  Wear a good support bra to prevent discomfort from breast tenderness.  Take warm sitz baths to soothe any pain or discomfort caused by hemorrhoids. Use hemorrhoid cream if your health care provider approves.  Rest with your legs elevated if you have leg cramps or low back pain.  If you develop varicose veins, wear support hose. Elevate your feet for 15 minutes, 3-4 times a day. Limit salt in your diet. Prenatal Care  Write down your questions. Take them to your prenatal visits.  Keep all your prenatal visits as told by your health care provider. This is important. Safety  Wear your seat belt at all times when driving.  Make a list of emergency phone numbers, including numbers for family, friends, the hospital, and police and fire departments. General instructions  Ask your health care provider for a referral to a local prenatal education class. Begin classes no later than the beginning of month 6 of your pregnancy.  Ask for help if you have counseling or nutritional needs during pregnancy. Your health care provider can offer advice or   refer you to specialists for help with various needs.  Do not use hot tubs, steam rooms, or saunas.  Do not douche or use tampons or scented sanitary pads.  Do not cross your legs for long periods of time.  Avoid cat litter boxes and soil used by cats. These carry germs that can cause birth defects in the baby and possibly loss of the fetus by miscarriage or stillbirth.  Avoid all smoking, herbs, alcohol, and unprescribed drugs. Chemicals in these products can affect the formation and growth of the baby.  Do not use any products that contain nicotine or tobacco, such as cigarettes and e-cigarettes. If you need help quitting, ask your health care provider.  Visit your dentist if you have not gone yet during your pregnancy. Use a soft toothbrush to brush your teeth and be gentle when you floss. Contact a health care provider  if:  You have dizziness.  You have mild pelvic cramps, pelvic pressure, or nagging pain in the abdominal area.  You have persistent nausea, vomiting, or diarrhea.  You have a bad smelling vaginal discharge.  You have pain when you urinate. Get help right away if:  You have a fever.  You are leaking fluid from your vagina.  You have spotting or bleeding from your vagina.  You have severe abdominal cramping or pain.  You have rapid weight gain or weight loss.  You have shortness of breath with chest pain.  You notice sudden or extreme swelling of your face, hands, ankles, feet, or legs.  You have not felt your baby move in over an hour.  You have severe headaches that do not go away when you take medicine.  You have vision changes. Summary  The second trimester is from week 14 through week 27 (months 4 through 6). It is also a time when the fetus is growing rapidly.  Your body goes through many changes during pregnancy. The changes vary from woman to woman.  Avoid all smoking, herbs, alcohol, and unprescribed drugs. These chemicals affect the formation and growth your baby.  Do not use any tobacco products, such as cigarettes, chewing tobacco, and e-cigarettes. If you need help quitting, ask your health care provider.  Contact your health care provider if you have any questions. Keep all prenatal visits as told by your health care provider. This is important. This information is not intended to replace advice given to you by your health care provider. Make sure you discuss any questions you have with your health care provider.         

## 2020-10-30 NOTE — Progress Notes (Signed)
Pt reports not having a BP cuff at work today. She took her BP on 10/28/20, reading 104/62. Pt c/o migraines.

## 2020-11-06 ENCOUNTER — Encounter: Payer: Medicaid Other | Admitting: Advanced Practice Midwife

## 2020-11-06 ENCOUNTER — Other Ambulatory Visit: Payer: Self-pay | Admitting: Advanced Practice Midwife

## 2020-11-06 MED ORDER — TERCONAZOLE 0.4 % VA CREA: 1.0000 | TOPICAL_CREAM | Freq: Every day | VAGINAL | 0 refills | Status: DC

## 2020-11-20 ENCOUNTER — Encounter: Payer: Self-pay | Admitting: Advanced Practice Midwife

## 2020-11-20 ENCOUNTER — Other Ambulatory Visit: Payer: Self-pay

## 2020-11-20 ENCOUNTER — Ambulatory Visit (INDEPENDENT_AMBULATORY_CARE_PROVIDER_SITE_OTHER): Payer: Medicaid Other

## 2020-11-20 ENCOUNTER — Ambulatory Visit (INDEPENDENT_AMBULATORY_CARE_PROVIDER_SITE_OTHER): Payer: Medicaid Other | Admitting: Advanced Practice Midwife

## 2020-11-20 VITALS — BP 103/70 | HR 63 | Wt 175.0 lb

## 2020-11-20 DIAGNOSIS — Z3402 Encounter for supervision of normal first pregnancy, second trimester: Secondary | ICD-10-CM

## 2020-11-20 DIAGNOSIS — Z363 Encounter for antenatal screening for malformations: Secondary | ICD-10-CM

## 2020-11-20 DIAGNOSIS — Z3A19 19 weeks gestation of pregnancy: Secondary | ICD-10-CM

## 2020-11-20 DIAGNOSIS — Z1379 Encounter for other screening for genetic and chromosomal anomalies: Secondary | ICD-10-CM

## 2020-11-20 NOTE — Progress Notes (Signed)
   LOW-RISK PREGNANCY VISIT Patient name: Sarah Blanchard MRN [id number]  Date of birth: [birth year] Chief Complaint:   Routine Prenatal Visit ([place] & 2nd IT today.)  History of Present Illness:   Sarah Blanchard is a 30 y.o. G1P0 female at [redacted]w[redacted]d with an Estimated Date of Delivery: 04/16/21 being seen today for ongoing management of a low-risk pregnancy.  Today she reports sore and swollen nipples. Contractions: Not present. Vag. Bleeding: None.  Movement: Present. denies leaking of fluid. Review of Systems:   Pertinent items are noted in HPI Denies abnormal vaginal discharge w/ itching/odor/irritation, headaches, visual changes, shortness of breath, chest pain, abdominal pain, severe nausea/vomiting, or problems with urination or bowel movements unless otherwise stated above. Pertinent History Reviewed:  Reviewed past medical,surgical, social, obstetrical and family history.  Reviewed problem list, medications and allergies. Physical Assessment:   Vitals:   11/20/20 1006  BP: 103/70  Pulse: 63  Weight: 175 lb (79.4 kg)  Body mass index is 29.12 kg/m.        Physical Examination:   General appearance: Well appearing, and in no distress  Mental status: Alert, oriented to person, place, and time  Skin: Warm & dry  Cardiovascular: Normal heart rate noted  Respiratory: Normal respiratory effort, no distress  Breast:  Areolar changes asso w/pg  Abdomen: Soft, gravid, nontender  Pelvic: Cervical exam deferred         Extremities: Edema: Trace  Fetal Status:     Movement: Present   [place] 19 wks,cephalic,anterior placenta gr 0,normal ovaries,SVP of fluid 4.8 cm,CX length 3.5 cm,mult small bilat choroid plexus cyst,cx 3.5 cm,EFW 252 g 28%,fhr 146 bpm,anatomy complete  Chaperone: n/a    No results found for this or any previous visit (from the past 24 hour(s)).  Assessment & Plan:  1) Low-risk pregnancy G1P0 at [redacted]w[redacted]d with an Estimated Date of Delivery: 04/16/21     Meds: No orders of the  defined types were placed in this encounter.  Labs/procedures today: 2nd IT  Plan:  Continue routine obstetrical care  Next visit: prefers in person    Reviewed: Preterm labor symptoms and general obstetric precautions including but not limited to vaginal bleeding, contractions, leaking of fluid and fetal movement were reviewed in detail with the patient.  All questions were answered. Has home bp cuff. Rx faxed to. Check bp weekly, let us know if >140/90.   Follow-up: No follow-ups on file.  Orders Placed This Encounter  Procedures  . INTEGRATED 2   Christin Fudge DNP, CNM 11/20/2020 10:40 AM

## 2020-11-20 NOTE — Progress Notes (Signed)
[place] 19 wks,cephalic,anterior placenta gr 0,normal ovaries,SVP of fluid 4.8 cm,CX length 3.5 cm,mult small bilat choroid plexus cyst,cx 3.5 cm,EFW 252 g 28%,fhr 146 bpm,anatomy complete

## 2020-11-20 NOTE — Patient Instructions (Signed)
Sarah Blanchard, I greatly value your feedback.  If you receive a survey following your visit with [place] today, we appreciate you taking the time to fill it out.  Thanks, Nigel Berthold, CNM     [place]!!! It is now [place] at [facility] ([place], [place] [address]) Entrance located off of [place] parking   Go to [email or web address] to register for FREE online childbirth classes    Second Trimester of Pregnancy The second trimester is from week 14 through week 27 (months 4 through 6). The second trimester is often a time when you feel your best. Your body has adjusted to being pregnant, and you begin to feel better physically. Usually, morning sickness has lessened or quit completely, you may have more energy, and you may have an increase in appetite. The second trimester is also a time when the fetus is growing rapidly. At the end of the sixth month, the fetus is about 9 inches long and weighs about 1 pounds. You will likely begin to feel the baby move (quickening) between 16 and 20 weeks of pregnancy. Body changes during your second trimester Your body continues to go through many changes during your second trimester. The changes vary from woman to woman.  Your weight will continue to increase. You will notice your lower abdomen bulging out.  You may begin to get stretch marks on your hips, abdomen, and breasts.  You may develop headaches that can be relieved by medicines. The medicines should be approved by your health care provider.  You may urinate more often because the fetus is pressing on your bladder.  You may develop or continue to have heartburn as a result of your pregnancy.  You may develop constipation because certain hormones are causing the muscles that push waste through your intestines to slow down.  You may develop hemorrhoids or swollen, bulging veins (varicose veins).  You may have  back pain. This is caused by: ? Weight gain. ? Pregnancy hormones that are relaxing the joints in your pelvis. ? A shift in weight and the muscles that support your balance.  Your breasts will continue to grow and they will continue to become tender.  Your gums may bleed and may be sensitive to brushing and flossing.  Dark spots or blotches (chloasma, mask of pregnancy) may develop on your face. This will likely fade after the baby is born.  A dark line from your belly button to the pubic area (linea nigra) may appear. This will likely fade after the baby is born.  You may have changes in your hair. These can include thickening of your hair, rapid growth, and changes in texture. Some women also have hair loss during or after pregnancy, or hair that feels dry or thin. Your hair will most likely return to normal after your baby is born.  What to expect at prenatal visits During a routine prenatal visit:  You will be weighed to make sure you and the fetus are growing normally.  Your blood pressure will be taken.  Your abdomen will be measured to track your baby's growth.  The fetal heartbeat will be listened to.  Any test results from the previous visit will be discussed.  Your health care provider may ask you:  How you are feeling.  If you are feeling the baby move.  If you have had any abnormal symptoms, such as leaking fluid, bleeding, severe headaches, or  abdominal cramping.  If you are using any tobacco products, including cigarettes, chewing tobacco, and electronic cigarettes.  If you have any questions.  Other tests that may be performed during your second trimester include:  Blood tests that check for: ? Low iron levels (anemia). ? High blood sugar that affects pregnant women (gestational diabetes) between 36 and 28 weeks. ? Rh antibodies. This is to check for a protein on red blood cells (Rh factor).  Urine tests to check for infections, diabetes, or protein in  the urine.  An ultrasound to confirm the proper growth and development of the baby.  An amniocentesis to check for possible genetic problems.  Fetal screens for spina bifida and Down syndrome.  HIV (human immunodeficiency virus) testing. Routine prenatal testing includes screening for HIV, unless you choose not to have this test.  Follow these instructions at home: Medicines  Follow your health care provider's instructions regarding medicine use. Specific medicines may be either safe or unsafe to take during pregnancy.  Take a prenatal vitamin that contains at least 600 micrograms (mcg) of folic acid.  If you develop constipation, try taking a stool softener if your health care provider approves. Eating and drinking  Eat a balanced diet that includes fresh fruits and vegetables, whole grains, good sources of protein such as meat, eggs, or tofu, and low-fat dairy. Your health care provider will help you determine the amount of weight gain that is right for you.  Avoid raw meat and uncooked cheese. These carry germs that can cause birth defects in the baby.  If you have low calcium intake from food, talk to your health care provider about whether you should take a daily calcium supplement.  Limit foods that are high in fat and processed sugars, such as fried and sweet foods.  To prevent constipation: ? Drink enough fluid to keep your urine clear or pale yellow. ? Eat foods that are high in fiber, such as fresh fruits and vegetables, whole grains, and beans. Activity  Exercise only as directed by your health care provider. Most women can continue their usual exercise routine during pregnancy. Try to exercise for 30 minutes at least 5 days a week. Stop exercising if you experience uterine contractions.  Avoid heavy lifting, wear low heel shoes, and practice good posture.  A sexual relationship may be continued unless your health care provider directs you otherwise. Relieving pain  and discomfort  Wear a good support bra to prevent discomfort from breast tenderness.  Take warm sitz baths to soothe any pain or discomfort caused by hemorrhoids. Use hemorrhoid cream if your health care provider approves.  Rest with your legs elevated if you have leg cramps or low back pain.  If you develop varicose veins, wear support hose. Elevate your feet for 15 minutes, 3-4 times a day. Limit salt in your diet. Prenatal Care  Write down your questions. Take them to your prenatal visits.  Keep all your prenatal visits as told by your health care provider. This is important. Safety  Wear your seat belt at all times when driving.  Make a list of emergency phone numbers, including numbers for family, friends, the hospital, and police and fire departments. General instructions  Ask your health care provider for a referral to a local prenatal education class. Begin classes no later than the beginning of month 6 of your pregnancy.  Ask for help if you have counseling or nutritional needs during pregnancy. Your health care provider can offer advice or  refer you to specialists for help with various needs.  Do not use hot tubs, steam rooms, or saunas.  Do not douche or use tampons or scented sanitary pads.  Do not cross your legs for long periods of time.  Avoid cat litter boxes and soil used by cats. These carry germs that can cause birth defects in the baby and possibly loss of the fetus by miscarriage or stillbirth.  Avoid all smoking, herbs, alcohol, and unprescribed drugs. Chemicals in these products can affect the formation and growth of the baby.  Do not use any products that contain nicotine or tobacco, such as cigarettes and e-cigarettes. If you need help quitting, ask your health care provider.  Visit your dentist if you have not gone yet during your pregnancy. Use a soft toothbrush to brush your teeth and be gentle when you floss. Contact a health care provider  if:  You have dizziness.  You have mild pelvic cramps, pelvic pressure, or nagging pain in the abdominal area.  You have persistent nausea, vomiting, or diarrhea.  You have a bad smelling vaginal discharge.  You have pain when you urinate. Get help right away if:  You have a fever.  You are leaking fluid from your vagina.  You have spotting or bleeding from your vagina.  You have severe abdominal cramping or pain.  You have rapid weight gain or weight loss.  You have shortness of breath with chest pain.  You notice sudden or extreme swelling of your face, hands, ankles, feet, or legs.  You have not felt your baby move in over an hour.  You have severe headaches that do not go away when you take medicine.  You have vision changes. Summary  The second trimester is from week 14 through week 27 (months 4 through 6). It is also a time when the fetus is growing rapidly.  Your body goes through many changes during pregnancy. The changes vary from woman to woman.  Avoid all smoking, herbs, alcohol, and unprescribed drugs. These chemicals affect the formation and growth your baby.  Do not use any tobacco products, such as cigarettes, chewing tobacco, and e-cigarettes. If you need help quitting, ask your health care provider.  Contact your health care provider if you have any questions. Keep all prenatal visits as told by your health care provider. This is important. This information is not intended to replace advice given to you by your health care provider. Make sure you discuss any questions you have with your health care provider.

## 2020-11-22 LAB — INTEGRATED 2
AFP MoM: 1.36
Alpha-Fetoprotein: 57 ng/mL
Crown Rump Length: 63.5 mm
DIA MoM: 1.41
DIA Value: 201.6 pg/mL
Estriol, Unconjugated: 1.9 ng/mL
Gest. Age on Collection Date: 12.6 weeks
Gestational Age: 18.6 weeks
Maternal Age at EDD: 31.1 yr
Nuchal Translucency (NT): 1.3 mm
Nuchal Translucency MoM: 0.91
Number of Fetuses: 1
PAPP-A MoM: 1.53
PAPP-A Value: 1393.4 ng/mL
Test Results:: NEGATIVE
Weight: 166 [lb_av]
Weight: 175 [lb_av]
hCG MoM: 1.75
hCG Value: 39.9 IU/mL
uE3 MoM: 1.27

## 2020-12-18 ENCOUNTER — Other Ambulatory Visit: Payer: Self-pay

## 2020-12-18 ENCOUNTER — Ambulatory Visit (INDEPENDENT_AMBULATORY_CARE_PROVIDER_SITE_OTHER): Payer: Medicaid Other | Admitting: Advanced Practice Midwife

## 2020-12-18 VITALS — BP 119/75 | HR 87 | Wt 184.0 lb

## 2020-12-18 DIAGNOSIS — Z3A23 23 weeks gestation of pregnancy: Secondary | ICD-10-CM

## 2020-12-18 DIAGNOSIS — O3503X0 Maternal care for (suspected) central nervous system malformation or damage in fetus, choroid plexus cysts, not applicable or unspecified: Secondary | ICD-10-CM

## 2020-12-18 DIAGNOSIS — Z1379 Encounter for other screening for genetic and chromosomal anomalies: Secondary | ICD-10-CM

## 2020-12-18 DIAGNOSIS — Z3402 Encounter for supervision of normal first pregnancy, second trimester: Secondary | ICD-10-CM

## 2020-12-18 DIAGNOSIS — O350XX0 Maternal care for (suspected) central nervous system malformation in fetus, not applicable or unspecified: Secondary | ICD-10-CM

## 2020-12-18 NOTE — Patient Instructions (Signed)

## 2020-12-18 NOTE — Progress Notes (Signed)
   LOW-RISK PREGNANCY VISIT Patient name: Sarah Blanchard MRN [id number]  Date of birth: May 21, 1991 Chief Complaint:   Routine Prenatal Visit  History of Present Illness:   Sarah Blanchard is a 30 y.o. G1P0 female at 100w0d with an Estimated Date of Delivery: 04/16/21 being seen today for ongoing management of a low-risk pregnancy.  Today she reports some pressure, especially after working a 12 hour shift . Contractions: Irritability. Vag. Bleeding: None.  Movement: Present. denies leaking of fluid. Review of Systems:   Pertinent items are noted in HPI Denies abnormal vaginal discharge w/ itching/odor/irritation, headaches, visual changes, shortness of breath, chest pain, abdominal pain, severe nausea/vomiting, or problems with urination or bowel movements unless otherwise stated above. Pertinent History Reviewed:  Reviewed past medical,surgical, social, obstetrical and family history.  Reviewed problem list, medications and allergies. Physical Assessment:   Vitals:   12/18/20 1038  BP: 119/75  Pulse: 87  Weight: 184 lb (83.5 kg)  Body mass index is 30.62 kg/m.        Physical Examination:   General appearance: Well appearing, and in no distress  Mental status: Alert, oriented to person, place, and time  Skin: Warm & dry  Cardiovascular: Normal heart rate noted  Respiratory: Normal respiratory effort, no distress  Abdomen: Soft, gravid, nontender  Pelvic: Cervical exam deferred         Extremities: Edema: Trace  Fetal Status: Fetal Heart Rate (bpm): 145   Movement: Present    Chaperone: n/a    No results found for this or any previous visit (from the past 24 hour(s)).  Assessment & Plan:  1) Low-risk pregnancy G1P0 at [redacted]w[redacted]d with an Estimated Date of Delivery: 04/16/21   2) Multiple Bilateral CP cysts , recheck at 28 weeks per [facility]   Meds: No orders of the defined types were placed in this encounter.  Labs/procedures today:   Plan:  Continue routine obstetrical care  Next  visit: prefers in person    Reviewed: Preterm labor symptoms and general obstetric precautions including but not limited to vaginal bleeding, contractions, leaking of fluid and fetal movement were reviewed in detail with the patient.  All questions were answered. Has home bp cuff.. Check bp weekly, let us know if >140/90.   Follow-up: Return in about 4 weeks (around 01/15/2021) for PN2/LROB, US:OB F/U:CP cysts.  Orders Placed This Encounter  Procedures  . US OB Follow Up   Christin Fudge DNP, CNM 12/18/2020 11:19 AM

## 2021-01-15 ENCOUNTER — Ambulatory Visit (INDEPENDENT_AMBULATORY_CARE_PROVIDER_SITE_OTHER): Payer: 59 | Admitting: Advanced Practice Midwife

## 2021-01-15 ENCOUNTER — Ambulatory Visit (INDEPENDENT_AMBULATORY_CARE_PROVIDER_SITE_OTHER): Payer: 59

## 2021-01-15 ENCOUNTER — Other Ambulatory Visit: Payer: Self-pay

## 2021-01-15 ENCOUNTER — Other Ambulatory Visit: Payer: Medicaid Other

## 2021-01-15 VITALS — BP 117/70 | HR 84 | Wt 192.0 lb

## 2021-01-15 DIAGNOSIS — Z23 Encounter for immunization: Secondary | ICD-10-CM

## 2021-01-15 DIAGNOSIS — Z131 Encounter for screening for diabetes mellitus: Secondary | ICD-10-CM

## 2021-01-15 DIAGNOSIS — Z3A26 26 weeks gestation of pregnancy: Secondary | ICD-10-CM

## 2021-01-15 DIAGNOSIS — Z3A27 27 weeks gestation of pregnancy: Secondary | ICD-10-CM

## 2021-01-15 DIAGNOSIS — O350XX0 Maternal care for (suspected) central nervous system malformation in fetus, not applicable or unspecified: Secondary | ICD-10-CM

## 2021-01-15 DIAGNOSIS — Z3402 Encounter for supervision of normal first pregnancy, second trimester: Secondary | ICD-10-CM

## 2021-01-15 DIAGNOSIS — O3503X0 Maternal care for (suspected) central nervous system malformation or damage in fetus, choroid plexus cysts, not applicable or unspecified: Secondary | ICD-10-CM

## 2021-01-15 NOTE — Progress Notes (Signed)
[place] 27 wks,cephalic,anterior placenta gr 1,cx 3.1 cm,fhr 146 bpm,afi 17.2 cm,resolved CPC,EFW 933 g 19%

## 2021-01-15 NOTE — Progress Notes (Signed)
   LOW-RISK PREGNANCY VISIT Patient name: Sarah Blanchard MRN [id number]  Date of birth: 22-Jun-1991 Chief Complaint:   Routine Prenatal Visit  History of Present Illness:   Sarah Blanchard is a 30 y.o. G1P0 female at [redacted]w[redacted]d with an Estimated Date of Delivery: 04/16/21 being seen today for ongoing management of a low-risk pregnancy.  Today she reports wanting to decrease work hours to 3 six hour shifts/week.. Contractions: Irritability. Vag. Bleeding: None.  Movement: Present. denies leaking of fluid. Review of Systems:   Pertinent items are noted in HPI Denies abnormal vaginal discharge w/ itching/odor/irritation, headaches, visual changes, shortness of breath, chest pain, abdominal pain, severe nausea/vomiting, or problems with urination or bowel movements unless otherwise stated above. Pertinent History Reviewed:  Reviewed past medical,surgical, social, obstetrical and family history.  Reviewed problem list, medications and allergies. Physical Assessment:   Vitals:   01/15/21 1029  BP: 117/70  Pulse: 84  Weight: 192 lb (87.1 kg)  Body mass index is 31.95 kg/m.        Physical Examination:   General appearance: Well appearing, and in no distress  Mental status: Alert, oriented to person, place, and time  Skin: Warm & dry  Cardiovascular: Normal heart rate noted  Respiratory: Normal respiratory effort, no distress  Abdomen: Soft, gravid, nontender  Pelvic: Cervical exam deferred         Extremities: Edema: Trace  Fetal Status: Fetal Heart Rate (bpm): [place]   Movement: Present   [place] 27 wks,cephalic,anterior placenta gr 1,cx 3.1 cm,fhr 146 bpm,afi 17.2 cm,resolved CPC,EFW 933 g 19%  Chaperone: n/a    No results found for this or any previous visit (from the past 24 hour(s)).  Assessment & Plan:  1) Low-risk pregnancy G1P0 at [redacted]w[redacted]d with an Estimated Date of Delivery: 04/16/21     Meds: No orders of the defined types were placed in this encounter.  Labs/procedures today:  PN2  Plan:  Continue routine obstetrical care  Next visit: prefers in person    Reviewed: Preterm labor symptoms and general obstetric precautions including but not limited to vaginal bleeding, contractions, leaking of fluid and fetal movement were reviewed in detail with the patient.  All questions were answered. Has home bp cuff.. Check bp weekly, let us know if >140/90.   Follow-up: Return in about 3 weeks (around 02/05/2021) for [place].  Orders Placed This Encounter  Procedures  . Tdap vaccine greater than or equal to 7yo IM   Christin Fudge DNP, CNM 01/15/2021 11:04 AM

## 2021-01-15 NOTE — Patient Instructions (Signed)
Sarah Blanchard, I greatly value your feedback.  If you receive a survey following your visit with [place] today, we appreciate you taking the time to fill it out.  Thanks, Nigel Berthold, CNM   [place]!!! It is now [place] at [facility] ([place], [place] [address]) Entrance located off of [place] parking   Go to [email or web address] to register for FREE online childbirth classes    Call the office [phone]) or go to [facility] if:  You begin to have strong, frequent contractions  Your water breaks.  Sometimes it is a big gush of fluid, sometimes it is just a trickle that keeps getting your panties wet or running down your legs  You have vaginal bleeding.  It is normal to have a small amount of spotting if your cervix was checked.   You don't feel your baby moving like normal.  If you don't, get you something to eat and drink and lay down and focus on feeling your baby move.  You should feel at least 10 movements in 2 hours.  If you don't, you should call the office or go to [facility].    Tdap Vaccine  It is recommended that you get the Tdap vaccine during the third trimester of EACH pregnancy to help protect your baby from getting pertussis (whooping cough)  27-36 weeks is the BEST time to do this so that you can pass the protection on to your baby. During pregnancy is better than after pregnancy, but if you are unable to get it during pregnancy it will be offered at the hospital.   You will be offered this vaccine in the office after 27 weeks. If you do not have health insurance, you can get this vaccine at the health department or your family doctor  Everyone who will be around your baby should also be up-to-date on their vaccines. Adults (who are not pregnant) only need 1 dose of Tdap during adulthood.   Third Trimester of Pregnancy The third trimester is from week 29 through week 42, months 7 through 9.  The third trimester is a time when the fetus is growing rapidly. At the end of the ninth month, the fetus is about 20 inches in length and weighs 6-10 pounds.  BODY CHANGES Your body goes through many changes during pregnancy. The changes vary from woman to woman.   Your weight will continue to increase. You can expect to gain 25-35 pounds (11-16 kg) by the end of the pregnancy.  You may begin to get stretch marks on your hips, abdomen, and breasts.  You may urinate more often because the fetus is moving lower into your pelvis and pressing on your bladder.  You may develop or continue to have heartburn as a result of your pregnancy.  You may develop constipation because certain hormones are causing the muscles that push waste through your intestines to slow down.  You may develop hemorrhoids or swollen, bulging veins (varicose veins).  You may have pelvic pain because of the weight gain and pregnancy hormones relaxing your joints between the bones in your pelvis. Backaches may result from overexertion of the muscles supporting your posture.  You may have changes in your hair. These can include thickening of your hair, rapid growth, and changes in texture. Some women also have hair loss during or after pregnancy, or hair that feels dry or thin. Your hair will most likely return to  normal after your baby is born.  Your breasts will continue to grow and be tender. A yellow discharge may leak from your breasts called colostrum.  Your belly button may stick out.  You may feel short of breath because of your expanding uterus.  You may notice the fetus "dropping," or moving lower in your abdomen.  You may have a bloody mucus discharge. This usually occurs a few days to a week before labor begins.  Your cervix becomes thin and soft (effaced) near your due date. WHAT TO EXPECT AT YOUR PRENATAL EXAMS  You will have prenatal exams every 2 weeks until week 36. Then, you will have weekly prenatal  exams. During a routine prenatal visit:  You will be weighed to make sure you and the fetus are growing normally.  Your blood pressure is taken.  Your abdomen will be measured to track your baby's growth.  The fetal heartbeat will be listened to.  Any test results from the previous visit will be discussed.  You may have a cervical check near your due date to see if you have effaced. At around 36 weeks, your caregiver will check your cervix. At the same time, your caregiver will also perform a test on the secretions of the vaginal tissue. This test is to determine if a type of bacteria, Group B streptococcus, is present. Your caregiver will explain this further. Your caregiver may ask you:  What your birth plan is.  How you are feeling.  If you are feeling the baby move.  If you have had any abnormal symptoms, such as leaking fluid, bleeding, severe headaches, or abdominal cramping.  If you have any questions. Other tests or screenings that may be performed during your third trimester include:  Blood tests that check for low iron levels (anemia).  Fetal testing to check the health, activity level, and growth of the fetus. Testing is done if you have certain medical conditions or if there are problems during the pregnancy. FALSE LABOR You may feel small, irregular contractions that eventually go away. These are called Braxton Hicks contractions, or false labor. Contractions may last for hours, days, or even weeks before true labor sets in. If contractions come at regular intervals, intensify, or become painful, it is best to be seen by your caregiver.  SIGNS OF LABOR   Menstrual-like cramps.  Contractions that are 5 minutes apart or less.  Contractions that start on the top of the uterus and spread down to the lower abdomen and back.  A sense of increased pelvic pressure or back pain.  A watery or bloody mucus discharge that comes from the vagina. If you have any of these  signs before the 37th week of pregnancy, call your caregiver right away. You need to go to the hospital to get checked immediately. HOME CARE INSTRUCTIONS   Avoid all smoking, herbs, alcohol, and unprescribed drugs. These chemicals affect the formation and growth of the baby.  Follow your caregiver's instructions regarding medicine use. There are medicines that are either safe or unsafe to take during pregnancy.  Exercise only as directed by your caregiver. Experiencing uterine cramps is a good sign to stop exercising.  Continue to eat regular, healthy meals.  Wear a good support bra for breast tenderness.  Do not use hot tubs, steam rooms, or saunas.  Wear your seat belt at all times when driving.  Avoid raw meat, uncooked cheese, cat litter boxes, and soil used by cats. These carry germs that can  cause birth defects in the baby.  Take your prenatal vitamins.  Try taking a stool softener (if your caregiver approves) if you develop constipation. Eat more high-fiber foods, such as fresh vegetables or fruit and whole grains. Drink plenty of fluids to keep your urine clear or pale yellow.  Take warm sitz baths to soothe any pain or discomfort caused by hemorrhoids. Use hemorrhoid cream if your caregiver approves.  If you develop varicose veins, wear support hose. Elevate your feet for 15 minutes, 3-4 times a day. Limit salt in your diet.  Avoid heavy lifting, wear low heal shoes, and practice good posture.  Rest a lot with your legs elevated if you have leg cramps or low back pain.  Visit your dentist if you have not gone during your pregnancy. Use a soft toothbrush to brush your teeth and be gentle when you floss.  A sexual relationship may be continued unless your caregiver directs you otherwise.  Do not travel far distances unless it is absolutely necessary and only with the approval of your caregiver.  Take prenatal classes to understand, practice, and ask questions about the  labor and delivery.  Make a trial run to the hospital.  Pack your hospital bag.  Prepare the baby's nursery.  Continue to go to all your prenatal visits as directed by your caregiver. SEEK MEDICAL CARE IF:  You are unsure if you are in labor or if your water has broken.  You have dizziness.  You have mild pelvic cramps, pelvic pressure, or nagging pain in your abdominal area.  You have persistent nausea, vomiting, or diarrhea.  You have a bad smelling vaginal discharge.  You have pain with urination. SEEK IMMEDIATE MEDICAL CARE IF:   You have a fever.  You are leaking fluid from your vagina.  You have spotting or bleeding from your vagina.  You have severe abdominal cramping or pain.  You have rapid weight loss or gain.  You have shortness of breath with chest pain.  You notice sudden or extreme swelling of your face, hands, ankles, feet, or legs.  You have not felt your baby move in over an hour.  You have severe headaches that do not go away with medicine.  You have vision changes. Document Released: 07/27/2001 Document Revised: 08/07/2013 Document Reviewed: 10/03/2012 [facility] Patient Information 2015 [place], [place]. This information is not intended to replace advice given to you by your health care provider. Make sure you discuss any questions you have with your health care provider.

## 2021-01-16 LAB — GLUCOSE TOLERANCE, 2 HOURS W/ 1HR
Glucose, 1 hour: 137 mg/dL (ref 65–179)
Glucose, 2 hour: 74 mg/dL (ref 65–152)
Glucose, Fasting: 79 mg/dL (ref 65–91)

## 2021-01-16 LAB — CBC
Hematocrit: 35.8 % (ref 34.0–46.6)
Hemoglobin: 12.1 g/dL (ref 11.1–15.9)
MCH: 30.5 pg (ref 26.6–33.0)
MCHC: 33.8 g/dL (ref 31.5–35.7)
MCV: 90 fL (ref 79–97)
Platelets: 200 10*3/uL (ref 150–450)
RBC: 3.97 x10E6/uL (ref 3.77–5.28)
RDW: 13 % (ref 11.7–15.4)
WBC: 7.7 10*3/uL (ref 3.4–10.8)

## 2021-01-16 LAB — RPR: RPR Ser Ql: NONREACTIVE

## 2021-01-16 LAB — ANTIBODY SCREEN: Antibody Screen: NEGATIVE

## 2021-01-16 LAB — HIV ANTIBODY (ROUTINE TESTING W REFLEX): HIV Screen 4th Generation wRfx: NONREACTIVE

## 2021-01-30 ENCOUNTER — Encounter: Payer: Self-pay | Admitting: *Deleted

## 2021-02-05 ENCOUNTER — Encounter: Payer: Self-pay | Admitting: Obstetrics & Gynecology

## 2021-02-05 ENCOUNTER — Ambulatory Visit (INDEPENDENT_AMBULATORY_CARE_PROVIDER_SITE_OTHER): Payer: Medicaid Other | Admitting: Obstetrics & Gynecology

## 2021-02-05 ENCOUNTER — Other Ambulatory Visit: Payer: Self-pay

## 2021-02-05 VITALS — BP 104/71 | HR 84 | Wt 201.0 lb

## 2021-02-05 DIAGNOSIS — Z3403 Encounter for supervision of normal first pregnancy, third trimester: Secondary | ICD-10-CM

## 2021-02-05 DIAGNOSIS — Z3A30 30 weeks gestation of pregnancy: Secondary | ICD-10-CM

## 2021-02-05 NOTE — Progress Notes (Signed)
   LOW-RISK PREGNANCY VISIT Patient name: Sarah Blanchard MRN [id number]  Date of birth: 03/08/1991 Chief Complaint:   Routine Prenatal Visit  History of Present Illness:   Sarah Blanchard is a 30 y.o. G1P0 female at [redacted]w[redacted]d with an Estimated Date of Delivery: 04/16/21 being seen today for ongoing management of a low-risk pregnancy.   Depression screen [facility] 2/9 01/15/2021 10/09/2020 08/29/2020  Decreased Interest 0 0 0  Down, Depressed, Hopeless 0 0 0  PHQ - 2 Score 0 0 0  Altered sleeping 0 0 0  Tired, decreased energy 1 0 3  Change in appetite 0 0 0  Feeling bad or failure about yourself  0 0 0  Trouble concentrating 0 0 0  Moving slowly or fidgety/restless 0 0 0  Suicidal thoughts 0 0 0  PHQ-9 Score 1 0 3    Today she reports fatigue. Contractions: Not present. Vag. Bleeding: None.  Movement: Present. denies leaking of fluid.  Noted episode at work where she had change in her peripheral vision and headache.  Required rest, hydration and resolved.  Reviewed conservative treatment.  Review of Systems:   Pertinent items are noted in HPI Denies abnormal vaginal discharge w/ itching/odor/irritation, headaches, visual changes, shortness of breath, chest pain, abdominal pain, severe nausea/vomiting, or problems with urination or bowel movements unless otherwise stated above. Pertinent History Reviewed:  Reviewed past medical,surgical, social, obstetrical and family history.  Reviewed problem list, medications and allergies.  Physical Assessment:   Vitals:   02/05/21 1406  BP: 104/71  Pulse: 84  Weight: 201 lb (91.2 kg)  Body mass index is 33.45 kg/m.        Physical Examination:   General appearance: Well appearing, and in no distress  Mental status: Alert, oriented to person, place, and time  Skin: Warm & dry  Respiratory: Normal respiratory effort, no distress  Abdomen: Soft, gravid, nontender  Pelvic: Cervical exam deferred         Extremities: Edema: Trace  Psych:  mood  and affect appropriate  Fetal Status: Fetal Heart Rate (bpm): 140 Fundal Height: 30 cm Movement: Present    Chaperone: n/a    No results found for this or any previous visit (from the past 24 hour(s)).   Assessment & Plan:  1) Low-risk pregnancy G1P0 at [redacted]w[redacted]d with an Estimated Date of Delivery: 04/16/21    Meds: No orders of the defined types were placed in this encounter.  Labs/procedures today: none  Plan:  Continue routine obstetrical care Next visit: prefers in person    Reviewed: Preterm labor symptoms and general obstetric precautions including but not limited to vaginal bleeding, contractions, leaking of fluid and fetal movement were reviewed in detail with the patient.  All questions were answered. Pt has home bp cuff. Check bp weekly, let us know if >140/90.   Follow-up: Return in about 2 weeks (around 02/19/2021) for [place] visit.  No orders of the defined types were placed in this encounter.   Janyth Pupa, DO Attending [place], [facility] for [employer], [place]

## 2021-02-09 ENCOUNTER — Telehealth: Payer: Self-pay | Admitting: Internal Medicine

## 2021-02-09 NOTE — Telephone Encounter (Signed)
She needs to reach out to OB/GYN as they know meds that would be safe to take during pregnancy (such as additional OTC agents like H2 blockers, etc). From dietary standpoint, avoid carbonation, caffeine, fatty foods, fried foods, spicy foods.

## 2021-02-09 NOTE — Telephone Encounter (Signed)
Spoke to pt.  She was diagnosed with small hiatal hernia.  She lost weight and said the acid reflux went away.  31 weeks preg now and acid reflux back.   OB/GYN put her on Protonix twice daily.  Not working anymore.  Hurting in sternum area and constant burning sensation around esophagus for 3 days.  Informed pt that she really needs ov.  No availability any time soon. Pt wants to know if we can recommend something to help.

## 2021-02-09 NOTE — Telephone Encounter (Signed)
Sarah Blanchard, SHE SAW DR FIELDS AND WAS DIAGNOSED WITH A HIATIAL HERNIA, SHE IS NOW PREGNANT AND IS HAVING SOME ISSUES.  NEEDS TO SPEAK TO A NURSE

## 2021-02-09 NOTE — Telephone Encounter (Signed)
Spoke to pt.  She was advised to contact OB/GYN to see what meds would be safe to take during pregnancy.  She was given information about avoiding carbonation, caffeine, fatty foods, fried foods, and spicy foods.  Pt voiced understanding.

## 2021-02-11 ENCOUNTER — Ambulatory Visit (INDEPENDENT_AMBULATORY_CARE_PROVIDER_SITE_OTHER): Payer: Medicaid Other | Admitting: Advanced Practice Midwife

## 2021-02-11 ENCOUNTER — Other Ambulatory Visit: Payer: Self-pay

## 2021-02-11 VITALS — BP 100/61 | HR 86 | Wt 195.0 lb

## 2021-02-11 DIAGNOSIS — Z3403 Encounter for supervision of normal first pregnancy, third trimester: Secondary | ICD-10-CM

## 2021-02-11 DIAGNOSIS — Z3A30 30 weeks gestation of pregnancy: Secondary | ICD-10-CM

## 2021-02-11 MED ORDER — SUCRALFATE 1 G PO TABS: 1.0000 g | ORAL_TABLET | Freq: Three times a day (TID) | ORAL | 3 refills | Status: DC

## 2021-02-11 MED ORDER — PANTOPRAZOLE SODIUM 40 MG PO TBEC: 40.0000 mg | DELAYED_RELEASE_TABLET | Freq: Every day | ORAL | 4 refills | Status: DC

## 2021-02-11 NOTE — Progress Notes (Signed)
   LOW-RISK PREGNANCY VISIT- work in for GERD Patient name: Sarah Blanchard MRN [id number]  Date of birth: 1990-12-08 Chief Complaint:   Abdominal Pain  History of Present Illness:   Sarah Blanchard is a 30 y.o. G1P0 female at [redacted]w[redacted]d with an Estimated Date of Delivery: 04/16/21 being seen today for ongoing management of a low-risk pregnancy.  Today she reports  having GERD with taking daily Protonix 20mg , but it has suddenly worsened and is symptomatic even with water ; had a hiatal hernia dx in 2019 and was worried it was that. Contractions: Irritability. Vag. Bleeding: None.  Movement: Present. denies leaking of fluid. Review of Systems:   Pertinent items are noted in HPI Denies abnormal vaginal discharge w/ itching/odor/irritation, headaches, visual changes, shortness of breath, chest pain, abdominal pain, severe nausea/vomiting, or problems with urination or bowel movements unless otherwise stated above. Pertinent History Reviewed:  Reviewed past medical,surgical, social, obstetrical and family history.  Reviewed problem list, medications and allergies. Physical Assessment:   Vitals:   02/11/21 1441  BP: 100/61  Pulse: 86  Weight: 195 lb (88.5 kg)  Body mass index is 32.45 kg/m.        Physical Examination:   General appearance: Well appearing, and in no distress  Mental status: Alert, oriented to person, place, and time  Skin: Warm & dry  Cardiovascular: Normal heart rate noted  Respiratory: Normal respiratory effort, no distress  Abdomen: Soft, gravid, nontender  Pelvic: Cervical exam deferred         Extremities: Edema: Trace  Fetal Status: Fetal Heart Rate (bpm): 156 Fundal Height: 31 cm Movement: Present    No results found for this or any previous visit (from the past 24 hour(s)).  Assessment & Plan:  1) Low-risk pregnancy G1P0 at [redacted]w[redacted]d with an Estimated Date of Delivery: 04/16/21   2) Worsening GERD, may increase Protonix to 40mg  and also add Carafate 1gm tid prn    Meds:  Meds ordered this encounter  Medications   pantoprazole (PROTONIX) 40 MG tablet    Sig: Take 1 tablet (40 mg total) by mouth daily.    Dispense:  30 tablet    Refill:  4    Order Specific Question:   Supervising Provider    Answer:   Tania Ade H [[id number]]   sucralfate (CARAFATE) 1 g tablet    Sig: Take 1 tablet (1 g total) by mouth 4 (four) times daily -  with meals and at bedtime.    Dispense:  90 tablet    Refill:  3    Order Specific Question:   Supervising Provider    Answer:   Florian Buff [[id number]]   Labs/procedures today: none  Plan:  Continue routine obstetrical care; wants to keep appt next week as scheduled  Reviewed: Preterm labor symptoms and general obstetric precautions including but not limited to vaginal bleeding, contractions, leaking of fluid and fetal movement were reviewed in detail with the patient.  All questions were answered. Has home bp cuff. Check bp weekly, let us know if >140/90.   Follow-up: Return for As scheduled.  No orders of the defined types were placed in this encounter.  Myrtis Ser CNM 02/11/2021 3:27 PM

## 2021-02-11 NOTE — Patient Instructions (Signed)
[place], thank you for choosing our office today! We appreciate the opportunity to meet your healthcare needs. You may receive a short survey by mail, e-mail, or through [employer]. If you are happy with your care we would appreciate if you could take just a few minutes to complete the survey questions. We read all of your comments and take your feedback very seriously. Thank you again for choosing our office.  Center for [employer] Team at [place] at [facility] ([place],  [address]) Entrance C, located off of [place] parking   CLASSES: Go to [email or web address] to register for classes (childbirth, breastfeeding, waterbirth, infant CPR, daddy bootcamp, etc.)  Call the office [phone]) or go to [facility] if: You begin to have strong, frequent contractions Your water breaks.  Sometimes it is a big gush of fluid, sometimes it is just a trickle that keeps getting your panties wet or running down your legs You have vaginal bleeding.  It is normal to have a small amount of spotting if your cervix was checked.  You don't feel your baby moving like normal.  If you don't, get you something to eat and drink and lay down and focus on feeling your baby move.   If your baby is still not moving like normal, you should call the office or go to [facility].  Call the office [phone]) or go to [facility] hospital for these signs of pre-eclampsia: Severe headache that does not go away with Tylenol Visual changes- seeing spots, double, blurred vision Pain under your right breast or upper abdomen that does not go away with Tums or heartburn medicine Nausea and/or vomiting Severe swelling in your hands, feet, and face   Tdap Vaccine It is recommended that you get the Tdap vaccine during the third trimester of EACH pregnancy to help protect your baby from getting pertussis (whooping cough) 27-36 weeks is the BEST time to do  this so that you can pass the protection on to your baby. During pregnancy is better than after pregnancy, but if you are unable to get it during pregnancy it will be offered at the hospital.  You can get this vaccine with [place], at the health department, your family doctor, or some local pharmacies Everyone who will be around your baby should also be up-to-date on their vaccines before the baby comes. Adults (who are not pregnant) only need 1 dose of Tdap during adulthood.   [facility] Pediatricians/Family Doctors [facility]): [address] [place], [facility]: [address] [place], [phone]                [place] [facility]): [place], [phone] (call to ask if accepting patients) [facility] Department: [address], [place], [place] Pediatricians/Family Doctors [facility]): [place]. [place], [address], [place] [facility]: [address] [place], [place] [facility]: [place], [place] [facility]): [phone] [facility]: [address], [place] [facility]: [address], [place] [facility]: [phone], [phone]  Home Blood Pressure Monitoring for Patients   Your provider has recommended that you check your  blood pressure (BP) at least once a week at home. If you do not have a blood pressure cuff at home, one will be provided for you. Contact your provider if you have not received your monitor within 1 week.   Helpful Tips for Accurate Home Blood Pressure Checks  Don't smoke, exercise, or drink caffeine 30 minutes before checking your BP Use the restroom before checking your BP (a full bladder can raise your  pressure) Relax in a comfortable upright chair Feet on the ground Left arm resting comfortably on a flat surface at the level of your heart Legs uncrossed Back supported Sit quietly and don't talk Place the cuff on your bare arm Adjust snuggly, so that only two fingertips can fit between your skin and the top of the cuff Check 2 readings separated by at least one minute Keep a log of your BP readings For a visual, please reference this diagram: http://ccnc.care/bpdiagram  Provider Name: [facility]     Phone: [phone]  Zone 1: ALL CLEAR  Continue to monitor your symptoms:  BP reading is less than 140 (top number) or less than 90 (bottom number)  No right upper stomach pain No headaches or seeing spots No feeling nauseated or throwing up No swelling in face and hands  Zone 2: CAUTION Call your doctor's office for any of the following:  BP reading is greater than 140 (top number) or greater than 90 (bottom number)  Stomach pain under your ribs in the middle or right side Headaches or seeing spots Feeling nauseated or throwing up Swelling in face and hands  Zone 3: EMERGENCY  Seek immediate medical care if you have any of the following:  BP reading is greater than160 (top number) or greater than 110 (bottom number) Severe headaches not improving with Tylenol Serious difficulty catching your breath Any worsening symptoms from Zone 2   Third Trimester of Pregnancy The third trimester is from week 29 through week 42, months 7 through 9. The third trimester is a time when the fetus is growing rapidly. At the end of the ninth month, the fetus is about 20 inches in length and weighs 6-10 pounds.  BODY CHANGES Your body goes through many changes during pregnancy. The changes vary from woman to woman.  Your weight will continue to increase. You can expect to gain 25-35 pounds (11-16 kg) by the end of the pregnancy. You may begin to get stretch marks on your hips, abdomen,  and breasts. You may urinate more often because the fetus is moving lower into your pelvis and pressing on your bladder. You may develop or continue to have heartburn as a result of your pregnancy. You may develop constipation because certain hormones are causing the muscles that push waste through your intestines to slow down. You may develop hemorrhoids or swollen, bulging veins (varicose veins). You may have pelvic pain because of the weight gain and pregnancy hormones relaxing your joints between the bones in your pelvis. Backaches may result from overexertion of the muscles supporting your posture. You may have changes in your hair. These can include thickening of your hair, rapid growth, and changes in texture. Some women also have hair loss during or after pregnancy, or hair that feels dry or thin. Your hair will most likely return to normal after your baby is born. Your breasts will continue to grow and be tender. A yellow discharge may leak from your breasts called colostrum. Your belly button may stick out. You may   feel short of breath because of your expanding uterus. You may notice the fetus "dropping," or moving lower in your abdomen. You may have a bloody mucus discharge. This usually occurs a few days to a week before labor begins. Your cervix becomes thin and soft (effaced) near your due date. WHAT TO EXPECT AT YOUR PRENATAL EXAMS  You will have prenatal exams every 2 weeks until week 36. Then, you will have weekly prenatal exams. During a routine prenatal visit: You will be weighed to make sure you and the fetus are growing normally. Your blood pressure is taken. Your abdomen will be measured to track your baby's growth. The fetal heartbeat will be listened to. Any test results from the previous visit will be discussed. You may have a cervical check near your due date to see if you have effaced. At around 36 weeks, your caregiver will check your cervix. At the same time, your  caregiver will also perform a test on the secretions of the vaginal tissue. This test is to determine if a type of bacteria, Group B streptococcus, is present. Your caregiver will explain this further. Your caregiver may ask you: What your birth plan is. How you are feeling. If you are feeling the baby move. If you have had any abnormal symptoms, such as leaking fluid, bleeding, severe headaches, or abdominal cramping. If you have any questions. Other tests or screenings that may be performed during your third trimester include: Blood tests that check for low iron levels (anemia). Fetal testing to check the health, activity level, and growth of the fetus. Testing is done if you have certain medical conditions or if there are problems during the pregnancy. FALSE LABOR You may feel small, irregular contractions that eventually go away. These are called Braxton Hicks contractions, or false labor. Contractions may last for hours, days, or even weeks before true labor sets in. If contractions come at regular intervals, intensify, or become painful, it is best to be seen by your caregiver.  SIGNS OF LABOR  Menstrual-like cramps. Contractions that are 5 minutes apart or less. Contractions that start on the top of the uterus and spread down to the lower abdomen and back. A sense of increased pelvic pressure or back pain. A watery or bloody mucus discharge that comes from the vagina. If you have any of these signs before the 37th week of pregnancy, call your caregiver right away. You need to go to the hospital to get checked immediately. HOME CARE INSTRUCTIONS  Avoid all smoking, herbs, alcohol, and unprescribed drugs. These chemicals affect the formation and growth of the baby. Follow your caregiver's instructions regarding medicine use. There are medicines that are either safe or unsafe to take during pregnancy. Exercise only as directed by your caregiver. Experiencing uterine cramps is a good sign to  stop exercising. Continue to eat regular, healthy meals. Wear a good support bra for breast tenderness. Do not use hot tubs, steam rooms, or saunas. Wear your seat belt at all times when driving. Avoid raw meat, uncooked cheese, cat litter boxes, and soil used by cats. These carry germs that can cause birth defects in the baby. Take your prenatal vitamins. Try taking a stool softener (if your caregiver approves) if you develop constipation. Eat more high-fiber foods, such as fresh vegetables or fruit and whole grains. Drink plenty of fluids to keep your urine clear or pale yellow. Take warm sitz baths to soothe any pain or discomfort caused by hemorrhoids. Use hemorrhoid cream if   your caregiver approves. If you develop varicose veins, wear support hose. Elevate your feet for 15 minutes, 3-4 times a day. Limit salt in your diet. Avoid heavy lifting, wear low heal shoes, and practice good posture. Rest a lot with your legs elevated if you have leg cramps or low back pain. Visit your dentist if you have not gone during your pregnancy. Use a soft toothbrush to brush your teeth and be gentle when you floss. A sexual relationship may be continued unless your caregiver directs you otherwise. Do not travel far distances unless it is absolutely necessary and only with the approval of your caregiver. Take prenatal classes to understand, practice, and ask questions about the labor and delivery. Make a trial run to the hospital. Pack your hospital bag. Prepare the baby's nursery. Continue to go to all your prenatal visits as directed by your caregiver. SEEK MEDICAL CARE IF: You are unsure if you are in labor or if your water has broken. You have dizziness. You have mild pelvic cramps, pelvic pressure, or nagging pain in your abdominal area. You have persistent nausea, vomiting, or diarrhea. You have a bad smelling vaginal discharge. You have pain with urination. SEEK IMMEDIATE MEDICAL CARE IF:  You  have a fever. You are leaking fluid from your vagina. You have spotting or bleeding from your vagina. You have severe abdominal cramping or pain. You have rapid weight loss or gain. You have shortness of breath with chest pain. You notice sudden or extreme swelling of your face, hands, ankles, feet, or legs. You have not felt your baby move in over an hour. You have severe headaches that do not go away with medicine. You have vision changes. Document Released: 07/27/2001 Document Revised: 08/07/2013 Document Reviewed: 10/03/2012 [facility] Patient Information 2015 [place], [place]. This information is not intended to replace advice given to you by your health care provider. Make sure you discuss any questions you have with your health care provider.

## 2021-02-19 ENCOUNTER — Other Ambulatory Visit: Payer: Self-pay

## 2021-02-19 ENCOUNTER — Ambulatory Visit (INDEPENDENT_AMBULATORY_CARE_PROVIDER_SITE_OTHER): Payer: Medicaid Other | Admitting: Advanced Practice Midwife

## 2021-02-19 VITALS — BP 119/72 | HR 73 | Wt 196.0 lb

## 2021-02-19 DIAGNOSIS — Z3A32 32 weeks gestation of pregnancy: Secondary | ICD-10-CM

## 2021-02-19 DIAGNOSIS — Z3403 Encounter for supervision of normal first pregnancy, third trimester: Secondary | ICD-10-CM

## 2021-02-19 MED ORDER — OMEPRAZOLE 20 MG PO TBEC: 1.0000 | DELAYED_RELEASE_TABLET | Freq: Two times a day (BID) | ORAL | 3 refills | Status: DC

## 2021-02-19 NOTE — Progress Notes (Signed)
   LOW-RISK PREGNANCY VISIT Patient name: Sarah Blanchard MRN [id number]  Date of birth: 1990/08/21 Chief Complaint:   Routine Prenatal Visit  History of Present Illness:   LILIANNA CASE is a 30 y.o. G1P0 female at [redacted]w[redacted]d with an Estimated Date of Delivery: 04/16/21 being seen today for ongoing management of a low-risk pregnancy.  Today she reports prilosec works better for GERD, carafate helps too. Contractions: Irritability. Vag. Bleeding: None.  Movement: Present. denies leaking of fluid. Review of Systems:   Pertinent items are noted in HPI Denies abnormal vaginal discharge w/ itching/odor/irritation, headaches, visual changes, shortness of breath, chest pain, abdominal pain, severe nausea/vomiting, or problems with urination or bowel movements unless otherwise stated above. Pertinent History Reviewed:  Reviewed past medical,surgical, social, obstetrical and family history.  Reviewed problem list, medications and allergies. Physical Assessment:   Vitals:   02/19/21 0847  BP: 119/72  Pulse: 73  Weight: 196 lb (88.9 kg)  Body mass index is 32.62 kg/m.        Physical Examination:   General appearance: Well appearing, and in no distress  Mental status: Alert, oriented to person, place, and time  Skin: Warm & dry  Cardiovascular: Normal heart rate noted  Respiratory: Normal respiratory effort, no distress  Abdomen: Soft, gravid, nontender  Pelvic: Cervical exam deferred         Extremities: Edema: Trace  Fetal Status: Fetal Heart Rate (bpm): 150 Fundal Height: 33 cm Movement: Present    Chaperone: n/a    No results found for this or any previous visit (from the past 24 hour(s)).  Assessment & Plan:  1) Low-risk pregnancy G1P0 at [redacted]w[redacted]d with an Estimated Date of Delivery: 04/16/21   2) GERD, rx prilosec   Meds:  Meds ordered this encounter  Medications   Omeprazole 20 MG TBEC    Sig: Take 1 tablet (20 mg total) by mouth 2 (two) times daily.    Dispense:  60 tablet     Refill:  3    Order Specific Question:   Supervising Provider    Answer:   Florian Buff [[id number]]   Labs/procedures today: none  Plan:  Continue routine obstetrical care  Next visit: prefers in person    Reviewed: Preterm labor symptoms and general obstetric precautions including but not limited to vaginal bleeding, contractions, leaking of fluid and fetal movement were reviewed in detail with the patient.  All questions were answered. Has home bp cuff.. Check bp weekly, let us know if >140/90.   Follow-up: Return in about 2 weeks (around 03/05/2021) for [place].  No orders of the defined types were placed in this encounter.  Christin Fudge DNP, CNM 02/19/2021 9:12 AM

## 2021-02-19 NOTE — Patient Instructions (Signed)
Sarah Blanchard, I greatly value your feedback.  If you receive a survey following your visit with [place] today, we appreciate you taking the time to fill it out.  Thanks, Nigel Berthold, DNP, CNM  [place]!!! It is now [place] at [facility] ([place], [place] [address]) Entrance located off of [address] parking   Go to [email or web address] to register for FREE online childbirth classes    Call the office [phone]) or go to Lane if: You begin to have strong, frequent contractions Your water breaks.  Sometimes it is a big gush of fluid, sometimes it is just a trickle that keeps getting your panties wet or running down your legs You have vaginal bleeding.  It is normal to have a small amount of spotting if your cervix was checked.  You don't feel your baby moving like normal.  If you don't, get you something to eat and drink and lay down and focus on feeling your baby move.  You should feel at least 10 movements in 2 hours.  If you don't, you should call the office or go to [place] Blood Pressure Monitoring for Patients   Your provider has recommended that you check your blood pressure (BP) at least once a week at home. If you do not have a blood pressure cuff at home, one will be provided for you. Contact your provider if you have not received your monitor within 1 week.   Helpful Tips for Accurate Home Blood Pressure Checks  Don't smoke, exercise, or drink caffeine 30 minutes before checking your BP Use the restroom before checking your BP (a full bladder can raise your pressure) Relax in a comfortable upright chair Feet on the ground Left arm resting comfortably on a flat surface at the level of your heart Legs uncrossed Back supported Sit quietly and don't talk Place the cuff on your bare arm Adjust snuggly, so that only two fingertips can fit between your skin and the  top of the cuff Check 2 readings separated by at least one minute Keep a log of your BP readings For a visual, please reference this diagram: http://ccnc.care/bpdiagram  Provider Name: [facility]     Phone: [phone]  Zone 1: ALL CLEAR  Continue to monitor your symptoms:  BP reading is less than 140 (top number) or less than 90 (bottom number)  No right upper stomach pain No headaches or seeing spots No feeling nauseated or throwing up No swelling in face and hands  Zone 2: CAUTION Call your doctor's office for any of the following:  BP reading is greater than 140 (top number) or greater than 90 (bottom number)  Stomach pain under your ribs in the middle or right side Headaches or seeing spots Feeling nauseated or throwing up Swelling in face and hands  Zone 3: EMERGENCY  Seek immediate medical care if you have any of the following:  BP reading is greater than160 (top number) or greater than 110 (bottom number) Severe headaches not improving with Tylenol Serious difficulty catching your breath Any worsening symptoms from Zone 2

## 2021-02-25 ENCOUNTER — Encounter: Payer: Medicaid Other | Admitting: Advanced Practice Midwife

## 2021-03-06 ENCOUNTER — Encounter: Payer: Self-pay | Admitting: Women's Health

## 2021-03-06 ENCOUNTER — Other Ambulatory Visit: Payer: Self-pay

## 2021-03-06 ENCOUNTER — Ambulatory Visit (INDEPENDENT_AMBULATORY_CARE_PROVIDER_SITE_OTHER): Payer: Medicaid Other | Admitting: Women's Health

## 2021-03-06 VITALS — BP 109/76 | HR 76

## 2021-03-06 DIAGNOSIS — Z3A34 34 weeks gestation of pregnancy: Secondary | ICD-10-CM

## 2021-03-06 DIAGNOSIS — Z3403 Encounter for supervision of normal first pregnancy, third trimester: Secondary | ICD-10-CM

## 2021-03-06 DIAGNOSIS — R109 Unspecified abdominal pain: Secondary | ICD-10-CM

## 2021-03-06 DIAGNOSIS — R350 Frequency of micturition: Secondary | ICD-10-CM

## 2021-03-06 DIAGNOSIS — R300 Dysuria: Secondary | ICD-10-CM

## 2021-03-06 LAB — POCT URINALYSIS DIPSTICK OB
Glucose, UA: NEGATIVE
Ketones, UA: NEGATIVE
Nitrite, UA: NEGATIVE
POC,PROTEIN,UA: NEGATIVE

## 2021-03-06 MED ORDER — PROMETHAZINE HCL 25 MG PO TABS: 25.0000 mg | ORAL_TABLET | Freq: Four times a day (QID) | ORAL | 0 refills | Status: DC | PRN

## 2021-03-06 MED ORDER — TAMSULOSIN HCL 0.4 MG PO CAPS: 0.4000 mg | ORAL_CAPSULE | Freq: Every day | ORAL | 1 refills | Status: DC

## 2021-03-06 NOTE — Progress Notes (Signed)
LOW-RISK PREGNANCY VISIT Patient name: Sarah Blanchard MRN [id number]  Date of birth: [birth year] Chief Complaint:   Routine Prenatal Visit (Burning with urination, left flank pain)  History of Present Illness:   Sarah Blanchard is a 30 y.o. G1P0 female at 28w1dwith an Estimated Date of Delivery: 04/16/21 being seen today for ongoing management of a low-risk pregnancy.   Today she reports  colicky Lt flank pain and burning w/ urination since Sat. Pain lasts abo 114ms when it comes- progressively getting worse each time. Saw blood in urine the other day. No fever/chills. Some mild nausea when pain was bad. No vomiting. No h/o kidney stones. Has been drinking more Body Armour lately, also drinks Dr. PeMalachi Bondsnd green tea . Contractions: Irregular. Vag. Bleeding: None.  Movement: Present. denies leaking of fluid.  Dep[facility] screen [facility] 01/15/2021 10/09/2020 08/29/2020  Decreased Interest 0 0 0  Down, Depressed, Hopeless 0 0 0  PHQ - 2 Score 0 0 0  Altered sleeping 0 0 0  Tired, decreased energy 1 0 3  Change in appetite 0 0 0  Feeling bad or failure about yourself  0 0 0  Trouble concentrating 0 0 0  Moving slowly or fidgety/restless 0 0 0  Suicidal thoughts 0 0 0  PHQ-9 Score 1 0 3     GAD 7 : Generalized Anxiety Score 01/15/2021 10/09/2020 08/29/2020  Nervous, Anxious, on Edge 0 0 0  Control/stop worrying 0 0 0  Worry too much - different things 0 0 0  Trouble relaxing 0 0 0  Restless 0 0 0  Easily annoyed or irritable 0 0 0  Afraid - awful might happen 0 0 0  Total GAD 7 Score 0 0 0      Review of Systems:   Pertinent items are noted in HPI Denies abnormal vaginal discharge w/ itching/odor/irritation, headaches, visual changes, shortness of breath, chest pain, abdominal pain, severe nausea/vomiting, or problems with urination or bowel movements unless otherwise stated above. Pertinent History Reviewed:  Reviewed past medical,surgical, social, obstetrical and family history.   Reviewed problem list, medications and allergies. Physical Assessment:   Vitals:   03/06/21 1043  BP: 109/76  Pulse: 76  There is no height or weight on file to calculate BMI.        Physical Examination:   General appearance: Well appearing, and in no distress  Mental status: Alert, oriented to person, place, and time  Skin: Warm & dry  Cardiovascular: Normal heart rate noted  Respiratory: Normal respiratory effort, no distress  Abdomen: Soft, gravid, nontender  Back: +LCVAT  Pelvic: Cervical exam deferred         Extremities: Edema: Trace  Fetal Status: Fetal Heart Rate (bpm): 152 Fundal Height: 33 cm Movement: Present    Chaperone: N/A   Results for orders placed or performed in visit on 03/06/21 (from the past 24 hour(s))  POC Urinalysis Dipstick OB   Collection Time: 03/06/21 10:50 AM  Result Value Ref Range   Color, UA     Clarity, UA     Glucose, UA Negative Negative   Bilirubin, UA     Ketones, UA neg    Spec Grav, UA     Blood, UA moderate    pH, UA     POC,PROTEIN,UA Negative Negative, Trace, Small (1+), Moderate (2+), Large (3+), 4+   Urobilinogen, UA     Nitrite, UA neg    Leukocytes, UA Moderate (2+) (A) Negative  Appearance     Odor      Assessment & Plan:  1) Low-risk pregnancy G1P0 at 77w1dwith an Estimated Date of Delivery: [id number]  2) Colicky Lt flank pain, dysuria> mod blood and leuks on dip, no nitrites, afebrile, doesn't seem to be uti/pyelo- will send cx. Seems more c/w kidney stone. Discussed w/ [facility], rx phenergan and flomax (has sulfa allergy-migrines/facial swelling- very rare reaction w/ flomax if h/o sulfa allergy, per [facility] OK to give). Get nuns cap and strainer and strain q urine.No need for u/s right now. If pain becomes constant/severe, fever/chills, n/v to go to MAU.    Meds:  Meds ordered this encounter  Medications   promethazine (PHENERGAN) 25 MG tablet    Sig: Take 1 tablet (25 mg total) by mouth every 6 (six) hours as needed  (colicky flank pain).    Dispense:  30 tablet    Refill:  0    Order Specific Question:   Supervising Provider    Answer:   ETania AdeH [[id number]]   tamsulosin (FLOMAX) 0.4 MG CAPS capsule    Sig: Take 1 capsule (0.4 mg total) by mouth daily.    Dispense:  30 capsule    Refill:  1    Order Specific Question:   Supervising Provider    Answer:   EFlorian Buff[[id number]]    Labs/procedures today: urine culture  Plan:  Continue routine obstetrical care  Next visit: prefers will be in person for cultures     Reviewed: Preterm labor symptoms and general obstetric precautions including but not limited to vaginal bleeding, contractions, leaking of fluid and fetal movement were reviewed in detail with the patient.  All questions were answered. Does have home bp cuff. Office bp cuff given: not applicable. Check bp weekly, let uKoreaknow if consistently >140 and/or >90.  Follow-up: Return in about 2 weeks (around 03/20/2021) [place].  Future Appointments  Date Time Provider DMoclips 03/20/2021 10:10 AM BRoma Schanz CNM [facility]    Orders Placed This Encounter  Procedures   Urine Culture   POC Urinalysis Dipstick OB   KRoma SchanzCNM, WCarolina Healthcare Associates Inc7/22/2022 11:16 AM

## 2021-03-06 NOTE — Patient Instructions (Addendum)
Sarah Blanchard, thank you for choosing our office today! We appreciate the opportunity to meet your healthcare needs. You may receive a short survey by mail, e-mail, or through [employer]. If you are happy with your care we would appreciate if you could take just a few minutes to complete the survey questions. We read all of your comments and take your feedback very seriously. Thank you again for choosing our office.  Center for [employer] Team at [facility] ([place], [place] [address]) Entrance C, located off of [place] parking   CLASSES: Go to [email or web address] to register for classes (childbirth, breastfeeding, waterbirth, infant CPR, daddy bootcamp, etc.)  Call the office [phone]) or go to [facility] if: You begin to have strong, frequent contractions Your water breaks.  Sometimes it is a big gush of fluid, sometimes it is just a trickle that keeps getting your panties wet or running down your legs You have vaginal bleeding.  It is normal to have a small amount of spotting if your cervix was checked.  You don't feel your baby moving like normal.  If you don't, get you something to eat and drink and lay down and focus on feeling your baby move.   If your baby is still not moving like normal, you should call the office or go to [facility].  Call the office [phone]) or go to [facility] hospital for these signs of pre-eclampsia: Severe headache that does not go away with Tylenol Visual changes- seeing spots, double, blurred vision Pain under your right breast or upper abdomen that does not go away with Tums or heartburn medicine Nausea and/or vomiting Severe swelling in your hands, feet, and face   Tdap Vaccine It is recommended that you get the Tdap vaccine during the third trimester of EACH pregnancy to help protect your baby from getting pertussis (whooping cough) 27-36 weeks is the BEST time to do  this so that you can pass the protection on to your baby. During pregnancy is better than after pregnancy, but if you are unable to get it during pregnancy it will be offered at the hospital.  You can get this vaccine with [place], at the health department, your family doctor, or some local pharmacies Everyone who will be around your baby should also be up-to-date on their vaccines before the baby comes. Adults (who are not pregnant) only need 1 dose of Tdap during adulthood.   [facility] Pediatricians/Family Doctors [facility]): [address] [place], [facility]: [address] [place], [phone]                [facility]): [place], [phone] (call to ask if accepting patients) [facility] Department: [address], [place], [place] Pediatricians/Family Doctors [facility]): [place]. [place], [address], [facility]: [address] [place], [facility] [place]: [place], [facility]): [phone] [facility]: [address] [place]: [address], [place]: [phone], [phone]  Home Blood Pressure Monitoring for Patients   Your provider has recommended that you check your  blood pressure (BP) at least once a week at home. If you do not have a blood pressure cuff at home, one will be provided for you. Contact your provider if you have not received your monitor within 1 week.   Helpful Tips for Accurate Home Blood Pressure Checks  Don't smoke, exercise, or drink caffeine 30 minutes before checking your BP Use the restroom before checking your BP (a full bladder can raise your  pressure) Relax in a comfortable upright chair Feet on the ground Left arm resting comfortably on a flat surface at the level of your heart Legs uncrossed Back supported Sit quietly and don't talk Place the cuff on your bare arm Adjust snuggly, so that only two fingertips can fit between your skin and the top of the cuff Check 2 readings separated by at least one minute Keep a log of your BP readings For a visual, please reference this diagram: http://ccnc.care/bpdiagram  Provider Name: [facility]     Phone: [phone]  Zone 1: ALL CLEAR  Continue to monitor your symptoms:  BP reading is less than 140 (top number) or less than 90 (bottom number)  No right upper stomach pain No headaches or seeing spots No feeling nauseated or throwing up No swelling in face and hands  Zone 2: CAUTION Call your doctor's office for any of the following:  BP reading is greater than 140 (top number) or greater than 90 (bottom number)  Stomach pain under your ribs in the middle or right side Headaches or seeing spots Feeling nauseated or throwing up Swelling in face and hands  Zone 3: EMERGENCY  Seek immediate medical care if you have any of the following:  BP reading is greater than160 (top number) or greater than 110 (bottom number) Severe headaches not improving with Tylenol Serious difficulty catching your breath Any worsening symptoms from Zone 2  Preterm Labor and Birth Information  The normal length of a pregnancy is 39-41 weeks. Preterm labor is when labor starts before 37 completed weeks of pregnancy. What are the risk factors for preterm labor? Preterm labor is more likely to occur in women who: Have certain infections during pregnancy such as a bladder infection, sexually transmitted infection, or infection inside the uterus (chorioamnionitis). Have a shorter-than-normal cervix. Have gone into preterm labor before. Have had surgery on their cervix. Are younger than age 17  or older than age 35. Are African American. Are pregnant with twins or multiple babies (multiple gestation). Take street drugs or smoke while pregnant. Do not gain enough weight while pregnant. Became pregnant shortly after having been pregnant. What are the symptoms of preterm labor? Symptoms of preterm labor include: Cramps similar to those that can happen during a menstrual period. The cramps may happen with diarrhea. Pain in the abdomen or lower back. Regular uterine contractions that may feel like tightening of the abdomen. A feeling of increased pressure in the pelvis. Increased watery or bloody mucus discharge from the vagina. Water breaking (ruptured amniotic sac). Why is it important to recognize signs of preterm labor? It is important to recognize signs of preterm labor because babies who are born prematurely may not be fully developed. This can put them at an increased risk for: Long-term (chronic) heart and lung problems. Difficulty immediately after birth with regulating body systems, including blood sugar, body temperature, heart rate, and breathing rate. Bleeding in the brain. Cerebral palsy. Learning difficulties. Death. These risks are highest for babies who are born before 34 weeks   of pregnancy. How is preterm labor treated? Treatment depends on the length of your pregnancy, your condition, and the health of your baby. It may involve: Having a stitch (suture) placed in your cervix to prevent your cervix from opening too early (cerclage). Taking or being given medicines, such as: Hormone medicines. These may be given early in pregnancy to help support the pregnancy. Medicine to stop contractions. Medicines to help mature the baby's lungs. These may be prescribed if the risk of delivery is high. Medicines to prevent your baby from developing cerebral palsy. If the labor happens before 34 weeks of pregnancy, you may need to stay in the hospital. What should I do if I  think I am in preterm labor? If you think that you are going into preterm labor, call your health care provider right away. How can I prevent preterm labor in future pregnancies? To increase your chance of having a full-term pregnancy: Do not use any tobacco products, such as cigarettes, chewing tobacco, and e-cigarettes. If you need help quitting, ask your health care provider. Do not use street drugs or medicines that have not been prescribed to you during your pregnancy. Talk with your health care provider before taking any herbal supplements, even if you have been taking them regularly. Make sure you gain a healthy amount of weight during your pregnancy. Watch for infection. If you think that you might have an infection, get it checked right away. Make sure to tell your health care provider if you have gone into preterm labor before. This information is not intended to replace advice given to you by your health care provider. Make sure you discuss any questions you have with your health care provider. Document Revised: 11/24/2018 Document Reviewed: 12/24/2015 Elsevier Patient Education  2020 [place].  Kidney Stones  Kidney stones are solid, rock-like deposits that form inside of the kidneys. The kidneys are a pair of organs that make urine. A kidney stone may form in a kidney and move into other parts of the urinary tract, including the tubes that connect the kidneys to the bladder (ureters), the bladder, and the tube that carries urine out of the body (urethra). As the stone moves through these areas, it can cause intense pain and blockthe flow of urine. Kidney stones are created when high levels of certain minerals are found in the urine. The stones are usually passed out of the body through urination, but insome cases, medical treatment may be needed to remove them. What are the causes? Kidney stones may be caused by: A condition in which certain glands produce too much parathyroid  hormone (primary hyperparathyroidism), which causes too much calcium buildup in the blood. A buildup of uric acid crystals in the bladder (hyperuricosuria). Uric acid is a chemical that the body produces when you eat certain foods. It usually exits the body in the urine. Narrowing (stricture) of one or both of the ureters. A kidney blockage that is present at birth (congenital obstruction). Past surgery on the kidney or the ureters, such as gastric bypass surgery. What increases the risk? The following factors may make you more likely to develop this condition: Having had a kidney stone in the past. Having a family history of kidney stones. Not drinking enough water. Eating a diet that is high in protein, salt (sodium), or sugar. Being overweight or obese. What are the signs or symptoms? Symptoms of a kidney stone may include: Pain in the side of the abdomen, right below the ribs (flank  pain). Pain usually spreads (radiates) to the groin. Needing to urinate frequently or urgently. Painful urination. Blood in the urine (hematuria). Nausea. Vomiting. Fever and chills. How is this diagnosed? This condition may be diagnosed based on: Your symptoms and medical history. A physical exam. Blood tests. Urine tests. These may be done before and after the stone passes out of your body through urination. Imaging tests, such as a CT scan, abdominal X-ray, or ultrasound. A procedure to examine the inside of the bladder (cystoscopy). How is this treated? Treatment for kidney stones depends on the size, location, and makeup of the stones. Kidney stones will often pass out of the body through urination. You may need to: Increase your fluid intake to help pass the stone. In some cases, you may be given fluids through an IV and may need to be monitored at the hospital. Take medicine for pain. Make changes in your diet to help prevent kidney stones from coming back. Sometimes, medical procedures are  needed to remove a kidney stone. This may involve: A procedure to break up kidney stones using: A focused beam of light (laser therapy). Shock waves (extracorporeal shock wave lithotripsy). Surgery to remove kidney stones. This may be needed if you have severe pain or have stones that block your urinary tract. Follow these instructions at home: Medicines Take over-the-counter and prescription medicines only as told by your health care provider. Ask your health care provider if the medicine prescribed to you requires you to avoid driving or using heavy machinery. Eating and drinking Drink enough fluid to keep your urine pale yellow. You may be instructed to drink at least 8-10 glasses of water each day. This will help you pass the kidney stone. If directed, change your diet. This may include: Limiting how much sodium you eat. Eating more fruits and vegetables. Limiting how much animal protein--such as red meat, poultry, fish, and eggs--you eat. Follow instructions from your health care provider about eating or drinking restrictions. General instructions Collect urine samples as told by your health care provider. You may need to collect a urine sample: 24 hours after you pass the stone. 8-12 weeks after passing the kidney stone, and every 6-12 months after that. Strain your urine every time you urinate, for as long as directed. Use the strainer that your health care provider recommends. Do not throw out the kidney stone after passing it. Keep the stone so it can be tested by your health care provider. Testing the makeup of your kidney stone may help prevent you from getting kidney stones in the future. Keep all follow-up visits as told by your health care provider. This is important. You may need follow-up X-rays or ultrasounds to make sure that your stone has passed. How is this prevented? To prevent another kidney stone: Drink enough fluid to keep your urine pale yellow. This is the best way  to prevent kidney stones. Eat a healthy diet and follow recommendations from your health care provider about foods to avoid. You may be instructed to eat a low-protein diet. Recommendations vary depending on the type of kidney stone that you have. Maintain a healthy weight. Where to find more information [place] [facility]): www.kidney.[facility]): www.urologyhealth.org Contact a health care provider if: You have pain that gets worse or does not get better with medicine. Get help right away if: You have a fever or chills. You develop severe pain. You develop new abdominal pain. You faint. You are unable to urinate.  Summary Kidney stones are solid, rock-like deposits that form inside of the kidneys. Kidney stones can cause nausea, vomiting, blood in the urine, abdominal pain, and the urge to urinate frequently. Treatment for kidney stones depends on the size, location, and makeup of the stones. Kidney stones will often pass out of the body through urination. Kidney stones can be prevented by drinking enough fluids, eating a healthy diet, and maintaining a healthy weight. This information is not intended to replace advice given to you by your health care provider. Make sure you discuss any questions you have with your healthcare provider. Document Revised: 12/15/2018 Document Reviewed: 12/19/2018 Elsevier Patient Education  2022 [employer] (5th ed., pp. [phone]). [place], [place]: Elsevier.">  Dietary Guidelines to Help Prevent Kidney Stones Kidney stones are deposits of minerals and salts that form inside your kidneys. Your risk of developing kidney stones may be greater depending on your diet, your lifestyle, the medicines you take, and whether you have certain medical conditions. Most people can lower their chances of developing kidney stones by following the instructions below. Your dietitian may give you more specific  instructions depending on your overall health and the type of kidney stones youtend to develop. What are tips for following this plan? Reading food labels  Choose foods with "no salt added" or "low-salt" labels. Limit your salt (sodium) intake to less than 1,500 mg a day. Choose foods with calcium for each meal and snack. Try to eat about 300 mg of calcium at each meal. Foods that contain 200-500 mg of calcium a serving include: 8 oz (237 mL) of milk, calcium-fortifiednon-dairy milk, and calcium-fortifiedfruit juice. Calcium-fortified means that calcium has been added to these drinks. 8 oz (237 mL) of kefir, yogurt, and soy yogurt. 4 oz (114 g) of tofu. 1 oz (28 g) of cheese. 1 cup (150 g) of dried figs. 1 cup (91 g) of cooked broccoli. One 3 oz (85 g) can of sardines or mackerel. Most people need 1,000-1,500 mg of calcium a day. Talk to your dietitian abouthow much calcium is recommended for you. Shopping Buy plenty of fresh fruits and vegetables. Most people do not need to avoid fruits and vegetables, even if these foods contain nutrients that may contribute to kidney stones. When shopping for convenience foods, choose: Whole pieces of fruit. Pre-made salads with dressing on the side. Low-fat fruit and yogurt smoothies. Avoid buying frozen meals or prepared deli foods. These can be high in sodium. Look for foods with live cultures, such as yogurt and kefir. Choose high-fiber grains, such as whole-wheat breads, oat bran, and wheat cereals. Cooking Do not add salt to food when cooking. Place a salt shaker on the table and allow each person to add his or her own salt to taste. Use vegetable protein, such as beans, textured vegetable protein (TVP), or tofu, instead of meat in pasta, casseroles, and soups. Meal planning Eat less salt, if told by your dietitian. To do this: Avoid eating processed or pre-made food. Avoid eating fast food. Eat less animal protein, including cheese, meat,  poultry, or fish, if told by your dietitian. To do this: Limit the number of times you have meat, poultry, fish, or cheese each week. Eat a diet free of meat at least 2 days a week. Eat only one serving each day of meat, poultry, fish, or seafood. When you prepare animal protein, cut pieces into small portion sizes. For most meat and fish, one serving is about  the size of the palm of your hand. Eat at least five servings of fresh fruits and vegetables each day. To do this: Keep fruits and vegetables on hand for snacks. Eat one piece of fruit or a handful of berries with breakfast. Have a salad and fruit at lunch. Have two kinds of vegetables at dinner. Limit foods that are high in a substance called oxalate. These include: Spinach (cooked), rhubarb, beets, sweet potatoes, and Swiss chard. Peanuts. Potato chips, french fries, and baked potatoes with skin on. Nuts and nut products. Chocolate. If you regularly take a diuretic medicine, make sure to eat at least 1 or 2 servings of fruits or vegetables that are high in potassium each day. These include: Avocado. Banana. Orange, prune, carrot, or tomato juice. Baked potato. Cabbage. Beans and split peas. Lifestyle  Drink enough fluid to keep your urine pale yellow. This is the most important thing you can do. Spread your fluid intake throughout the day. If you drink alcohol: Limit how much you use to: 0-1 drink a day for women who are not pregnant. 0-2 drinks a day for men. Be aware of how much alcohol is in your drink. In the [place] one drink equals one 12 oz bottle of beer (355 mL), one 5 oz glass of wine (148 mL), or one 1 oz glass of hard liquor (44 mL). Lose weight if told by your health care provider. Work with your dietitian to find an eating plan and weight loss strategies that work best for you.  General information Talk to your health care provider and dietitian about taking daily supplements. You may be told the following  depending on your health and the cause of your kidney stones: Not to take supplements with vitamin C. To take a calcium supplement. To take a daily probiotic supplement. To take other supplements such as magnesium, fish oil, or vitamin B6. Take over-the-counter and prescription medicines only as told by your health care provider. These include supplements. What foods should I limit? Limit your intake of the following foods, or eat them as told by your dietitian. Vegetables Spinach. Rhubarb. Beets. Canned vegetables. Angie Fava. Olives. Baked potatoeswith skin. Grains Wheat bran. Baked goods. Salted crackers. Cereals high in sugar. Meats and other proteins Nuts. Nut butters. Large portions of meat, poultry, or fish. Salted, precooked,or cured meats, such as sausages, meat loaves, and hot dogs. Dairy Cheese. Beverages Regular soft drinks. Regular vegetable juice. Seasonings and condiments Seasoning blends with salt. Salad dressings. Soy sauce. Ketchup. Barbecue sauce. Other foods Canned soups. Canned pasta sauce. Casseroles. Pizza. Lasagna. Frozen meals.Potato chips. [place] fries. The items listed above may not be a complete list of foods and beverages you should limit. Contact a dietitian for more information. What foods should I avoid? Talk to your dietitian about specific foods you should avoid based on the typeof kidney stones you have and your overall health. Fruits Grapefruit. The item listed above may not be a complete list of foods and beverages you should avoid. Contact a dietitian for more information. Summary Kidney stones are deposits of minerals and salts that form inside your kidneys. You can lower your risk of kidney stones by making changes to your diet. The most important thing you can do is drink enough fluid. Drink enough fluid to keep your urine pale yellow. Talk to your dietitian about how much calcium you should have each day, and eat less salt and animal protein as  told by your dietitian. This information is not intended  to replace advice given to you by your health care provider. Make sure you discuss any questions you have with your healthcare provider. Document Revised: 07/26/2019 Document Reviewed: 07/26/2019 Elsevier Patient Education  2022 [employer].

## 2021-03-09 LAB — URINE CULTURE

## 2021-03-10 ENCOUNTER — Other Ambulatory Visit: Payer: Self-pay | Admitting: Obstetrics & Gynecology

## 2021-03-10 MED ORDER — CEFDINIR 300 MG PO CAPS: 300.0000 mg | ORAL_CAPSULE | Freq: Two times a day (BID) | ORAL | 0 refills | Status: DC

## 2021-03-17 DIAGNOSIS — Z029 Encounter for administrative examinations, unspecified: Secondary | ICD-10-CM

## 2021-03-20 ENCOUNTER — Ambulatory Visit (INDEPENDENT_AMBULATORY_CARE_PROVIDER_SITE_OTHER): Payer: Medicaid Other | Admitting: Women's Health

## 2021-03-20 ENCOUNTER — Encounter: Payer: Self-pay | Admitting: Women's Health

## 2021-03-20 ENCOUNTER — Other Ambulatory Visit: Payer: Self-pay

## 2021-03-20 VITALS — BP 127/75 | HR 69 | Wt 205.8 lb

## 2021-03-20 DIAGNOSIS — Z3403 Encounter for supervision of normal first pregnancy, third trimester: Secondary | ICD-10-CM

## 2021-03-20 DIAGNOSIS — Z3A36 36 weeks gestation of pregnancy: Secondary | ICD-10-CM

## 2021-03-20 NOTE — Patient Instructions (Signed)
[place], thank you for choosing our office today! We appreciate the opportunity to meet your healthcare needs. You may receive a short survey by mail, e-mail, or through [employer]. If you are happy with your care we would appreciate if you could take just a few minutes to complete the survey questions. We read all of your comments and take your feedback very seriously. Thank you again for choosing our office.  Center for [employer] Team at [place] [facility] ([place], [place] [address]) Entrance C, located off of [address] parking   CLASSES: Go to [email or web address] to register for classes (childbirth, breastfeeding, waterbirth, infant CPR, daddy bootcamp, etc.)  Call the office [phone]) or go to [facility] if: You begin to have strong, frequent contractions Your water breaks.  Sometimes it is a big gush of fluid, sometimes it is just a trickle that keeps getting your panties wet or running down your legs You have vaginal bleeding.  It is normal to have a small amount of spotting if your cervix was checked.  You don't feel your baby moving like normal.  If you don't, get you something to eat and drink and lay down and focus on feeling your baby move.   If your baby is still not moving like normal, you should call the office or go to [facility].  Call the office [phone]) or go to [facility] hospital for these signs of pre-eclampsia: Severe headache that does not go away with Tylenol Visual changes- seeing spots, double, blurred vision Pain under your right breast or upper abdomen that does not go away with Tums or heartburn medicine Nausea and/or vomiting Severe swelling in your hands, feet, and face   Tdap Vaccine It is recommended that you get the Tdap vaccine during the third trimester of EACH pregnancy to help protect your baby from getting pertussis (whooping cough) 27-36 weeks is the BEST time to do  this so that you can pass the protection on to your baby. During pregnancy is better than after pregnancy, but if you are unable to get it during pregnancy it will be offered at the hospital.  You can get this vaccine with [place], at the health department, your family doctor, or some local pharmacies Everyone who will be around your baby should also be up-to-date on their vaccines before the baby comes. Adults (who are not pregnant) only need 1 dose of Tdap during adulthood.   [facility] Pediatricians/Family Doctors [facility]): [address] [place], [place] [facility]: [address] [place], [phone]                [place] [facility]): [place], [phone] (call to ask if accepting patients) [facility] Department: [address], [place], [place] Pediatricians/Family Doctors [facility]): [place]. [place], [address], [place] [facility]: [address] [place], [place] [facility]: [place], [place] [facility]): [phone] [facility]: [address], [place]: [address], [place] [facility]: [phone], [phone]  Home Blood Pressure Monitoring for Patients   Your provider has recommended that you check your  blood pressure (BP) at least once a week at home. If you do not have a blood pressure cuff at home, one will be provided for you. Contact your provider if you have not received your monitor within 1 week.   Helpful Tips for Accurate Home Blood Pressure Checks  Don't smoke, exercise, or drink caffeine 30 minutes before checking your BP Use the restroom before checking your BP (a full bladder can raise your  pressure) Relax in a comfortable upright chair Feet on the ground Left arm resting comfortably on a flat surface at the level of your heart Legs uncrossed Back supported Sit quietly and don't talk Place the cuff on your bare arm Adjust snuggly, so that only two fingertips can fit between your skin and the top of the cuff Check 2 readings separated by at least one minute Keep a log of your BP readings For a visual, please reference this diagram: http://ccnc.care/bpdiagram  Provider Name: [facility]     Phone: [phone]  Zone 1: ALL CLEAR  Continue to monitor your symptoms:  BP reading is less than 140 (top number) or less than 90 (bottom number)  No right upper stomach pain No headaches or seeing spots No feeling nauseated or throwing up No swelling in face and hands  Zone 2: CAUTION Call your doctor's office for any of the following:  BP reading is greater than 140 (top number) or greater than 90 (bottom number)  Stomach pain under your ribs in the middle or right side Headaches or seeing spots Feeling nauseated or throwing up Swelling in face and hands  Zone 3: EMERGENCY  Seek immediate medical care if you have any of the following:  BP reading is greater than160 (top number) or greater than 110 (bottom number) Severe headaches not improving with Tylenol Serious difficulty catching your breath Any worsening symptoms from Zone 2  Preterm Labor and Birth Information  The normal length of a pregnancy is 39-41 weeks. Preterm labor is when labor starts before 37 completed weeks of pregnancy. What are the risk factors for preterm labor? Preterm labor is more likely to occur in women who: Have certain infections during pregnancy such as a bladder infection, sexually transmitted infection, or infection inside the uterus (chorioamnionitis). Have a shorter-than-normal cervix. Have gone into preterm labor before. Have had surgery on their cervix. Are younger than age 17  or older than age 35. Are African American. Are pregnant with twins or multiple babies (multiple gestation). Take street drugs or smoke while pregnant. Do not gain enough weight while pregnant. Became pregnant shortly after having been pregnant. What are the symptoms of preterm labor? Symptoms of preterm labor include: Cramps similar to those that can happen during a menstrual period. The cramps may happen with diarrhea. Pain in the abdomen or lower back. Regular uterine contractions that may feel like tightening of the abdomen. A feeling of increased pressure in the pelvis. Increased watery or bloody mucus discharge from the vagina. Water breaking (ruptured amniotic sac). Why is it important to recognize signs of preterm labor? It is important to recognize signs of preterm labor because babies who are born prematurely may not be fully developed. This can put them at an increased risk for: Long-term (chronic) heart and lung problems. Difficulty immediately after birth with regulating body systems, including blood sugar, body temperature, heart rate, and breathing rate. Bleeding in the brain. Cerebral palsy. Learning difficulties. Death. These risks are highest for babies who are born before 34 weeks   of pregnancy. How is preterm labor treated? Treatment depends on the length of your pregnancy, your condition, and the health of your baby. It may involve: Having a stitch (suture) placed in your cervix to prevent your cervix from opening too early (cerclage). Taking or being given medicines, such as: Hormone medicines. These may be given early in pregnancy to help support the pregnancy. Medicine to stop contractions. Medicines to help mature the baby's lungs. These may be prescribed if the risk of delivery is high. Medicines to prevent your baby from developing cerebral palsy. If the labor happens before 34 weeks of pregnancy, you may need to stay in the hospital. What should I do if I  think I am in preterm labor? If you think that you are going into preterm labor, call your health care provider right away. How can I prevent preterm labor in future pregnancies? To increase your chance of having a full-term pregnancy: Do not use any tobacco products, such as cigarettes, chewing tobacco, and e-cigarettes. If you need help quitting, ask your health care provider. Do not use street drugs or medicines that have not been prescribed to you during your pregnancy. Talk with your health care provider before taking any herbal supplements, even if you have been taking them regularly. Make sure you gain a healthy amount of weight during your pregnancy. Watch for infection. If you think that you might have an infection, get it checked right away. Make sure to tell your health care provider if you have gone into preterm labor before. This information is not intended to replace advice given to you by your health care provider. Make sure you discuss any questions you have with your health care provider. Document Revised: 11/24/2018 Document Reviewed: 12/24/2015 Elsevier Patient Education  2020 Elsevier Inc.   

## 2021-03-20 NOTE — Progress Notes (Signed)
LOW-RISK PREGNANCY VISIT Patient name: Sarah Blanchard MRN [id number]  Date of birth: 1991-02-09 Chief Complaint:   Routine Prenatal Visit  History of Present Illness:   Sarah Blanchard is a 30 y.o. G1P0 female at 72w1dwith an Estimated Date of Delivery: 04/16/21 being seen today for ongoing management of a low-risk pregnancy.   Today she reports  Lt flank pain improved some, still comes and goes, doesn't feel like she's passed a stone yet. Took flomax x 1, gave her migraine . Contractions: Irregular. Vag. Bleeding: None.  Movement: Present. denies leaking of fluid.  [facility] 01/15/2021 10/09/2020 08/29/2020  Decreased Interest 0 0 0  Down, Depressed, Hopeless 0 0 0  PHQ - 2 Score 0 0 0  Altered sleeping 0 0 0  Tired, decreased energy 1 0 3  Change in appetite 0 0 0  Feeling bad or failure about yourself  0 0 0  Trouble concentrating 0 0 0  Moving slowly or fidgety/restless 0 0 0  Suicidal thoughts 0 0 0  PHQ-9 Score 1 0 3     GAD 7 : Generalized Anxiety Score 01/15/2021 10/09/2020 08/29/2020  Nervous, Anxious, on Edge 0 0 0  Control/stop worrying 0 0 0  Worry too much - different things 0 0 0  Trouble relaxing 0 0 0  Restless 0 0 0  Easily annoyed or irritable 0 0 0  Afraid - awful might happen 0 0 0  Total GAD 7 Score 0 0 0      Review of Systems:   Pertinent items are noted in HPI Denies abnormal vaginal discharge w/ itching/odor/irritation, headaches, visual changes, shortness of breath, chest pain, abdominal pain, severe nausea/vomiting, or problems with urination or bowel movements unless otherwise stated above. Pertinent History Reviewed:  Reviewed past medical,surgical, social, obstetrical and family history.  Reviewed problem list, medications and allergies. Physical Assessment:   Vitals:   03/20/21 1018  BP: 127/75  Pulse: 69  Weight: 205 lb 12.8 oz (93.4 kg)  Body mass index is 34.25 kg/m.        Physical Examination:   General  appearance: Well appearing, and in no distress  Mental status: Alert, oriented to person, place, and time  Skin: Warm & dry  Cardiovascular: Normal heart rate noted  Respiratory: Normal respiratory effort, no distress  Abdomen: Soft, gravid, nontender  Pelvic: Cervical exam performed  Dilation: 1.5 Effacement (%): Thick Station: Ballotable  Extremities: Edema: Trace  Fetal Status: Fetal Heart Rate (bpm): 145 Fundal Height: 34 cm Movement: Present Presentation: Vertex  Chaperone: Latisha Cresenzo   No results found for this or any previous visit (from the past 24 hour(s)).  Assessment & Plan:  1) Low-risk pregnancy G1P0 at 336w1dith an Estimated Date of Delivery: 04/16/21    Meds: No orders of the defined types were placed in this encounter.  Labs/procedures today: GBS, GC/CT, and SVE  Plan:  Continue routine obstetrical care  Next visit: prefers in person    Reviewed: Preterm labor symptoms and general obstetric precautions including but not limited to vaginal bleeding, contractions, leaking of fluid and fetal movement were reviewed in detail with the patient.  All questions were answered. Does have home bp cuff. Office bp cuff given: not applicable. Check bp weekly, let usKoreanow if consistently >140 and/or >90.  Follow-up: Return in about 1 week (around 03/27/2021) [place].  No future appointments.  Orders Placed This Encounter  Procedures  Culture, beta strep (group b only)   Roma Schanz CNM, [facility] 03/20/2021 10:47 AM

## 2021-03-22 LAB — CERVICOVAGINAL ANCILLARY ONLY
Chlamydia: NEGATIVE
Comment: NEGATIVE
Comment: NORMAL
Neisseria Gonorrhea: NEGATIVE

## 2021-03-24 LAB — CULTURE, BETA STREP (GROUP B ONLY): Strep Gp B Culture: NEGATIVE

## 2021-03-26 ENCOUNTER — Other Ambulatory Visit: Payer: Self-pay

## 2021-03-26 ENCOUNTER — Telehealth: Payer: Self-pay

## 2021-03-26 ENCOUNTER — Encounter (HOSPITAL_COMMUNITY): Payer: Self-pay | Admitting: Obstetrics & Gynecology

## 2021-03-26 ENCOUNTER — Inpatient Hospital Stay (HOSPITAL_COMMUNITY): Payer: Medicaid Other | Admitting: Anesthesiology

## 2021-03-26 ENCOUNTER — Inpatient Hospital Stay (HOSPITAL_COMMUNITY)
Admission: AD | Admit: 2021-03-26 | Discharge: 2021-03-28 | DRG: 805 | Disposition: A | Discharge status: Home / Self Care | Payer: Medicaid Other | Attending: Obstetrics & Gynecology | Admitting: Obstetrics & Gynecology

## 2021-03-26 DIAGNOSIS — O9962 Diseases of the digestive system complicating childbirth: Secondary | ICD-10-CM | POA: Diagnosis present

## 2021-03-26 DIAGNOSIS — O9852 Other viral diseases complicating childbirth: Secondary | ICD-10-CM | POA: Diagnosis present

## 2021-03-26 DIAGNOSIS — Z87442 Personal history of urinary calculi: Secondary | ICD-10-CM | POA: Diagnosis not present

## 2021-03-26 DIAGNOSIS — Z87891 Personal history of nicotine dependence: Secondary | ICD-10-CM | POA: Diagnosis not present

## 2021-03-26 DIAGNOSIS — U071 COVID-19: Secondary | ICD-10-CM | POA: Diagnosis present

## 2021-03-26 DIAGNOSIS — Z3403 Encounter for supervision of normal first pregnancy, third trimester: Secondary | ICD-10-CM

## 2021-03-26 DIAGNOSIS — Z3A37 37 weeks gestation of pregnancy: Secondary | ICD-10-CM | POA: Diagnosis not present

## 2021-03-26 DIAGNOSIS — O4292 Full-term premature rupture of membranes, unspecified as to length of time between rupture and onset of labor: Secondary | ICD-10-CM | POA: Diagnosis present

## 2021-03-26 DIAGNOSIS — Z3490 Encounter for supervision of normal pregnancy, unspecified, unspecified trimester: Secondary | ICD-10-CM

## 2021-03-26 DIAGNOSIS — O4202 Full-term premature rupture of membranes, onset of labor within 24 hours of rupture: Secondary | ICD-10-CM | POA: Diagnosis not present

## 2021-03-26 DIAGNOSIS — K219 Gastro-esophageal reflux disease without esophagitis: Secondary | ICD-10-CM | POA: Diagnosis present

## 2021-03-26 LAB — CBC
HCT: 39.7 % (ref 36.0–46.0)
Hemoglobin: 13.7 g/dL (ref 12.0–15.0)
MCH: 30.7 pg (ref 26.0–34.0)
MCHC: 34.5 g/dL (ref 30.0–36.0)
MCV: 89 fL (ref 80.0–100.0)
Platelets: 155 10*3/uL (ref 150–400)
RBC: 4.46 MIL/uL (ref 3.87–5.11)
RDW: 13.2 % (ref 11.5–15.5)
WBC: 6.6 10*3/uL (ref 4.0–10.5)
nRBC: 0 % (ref 0.0–0.2)

## 2021-03-26 LAB — RESP PANEL BY RT-PCR (FLU A&B, COVID) ARPGX2
Influenza A by PCR: NEGATIVE
Influenza B by PCR: NEGATIVE
SARS Coronavirus 2 by RT PCR: POSITIVE — AB

## 2021-03-26 LAB — TYPE AND SCREEN
ABO/RH(D): O POS
Antibody Screen: NEGATIVE

## 2021-03-26 LAB — POCT FERN TEST: POCT Fern Test: POSITIVE

## 2021-03-26 MED ORDER — MISOPROSTOL 50MCG HALF TABLET
50.0000 ug | ORAL_TABLET | ORAL | Status: DC | PRN
  Administered 2021-03-26: 50 ug via BUCCAL
  Filled 2021-03-26: qty 1

## 2021-03-26 MED ORDER — TERBUTALINE SULFATE 1 MG/ML IJ SOLN
0.2500 mg | Freq: Once | INTRAMUSCULAR | Status: AC | PRN
  Administered 2021-03-27: 0.25 mg via SUBCUTANEOUS
  Filled 2021-03-26: qty 1

## 2021-03-26 MED ORDER — SOD CITRATE-CITRIC ACID 500-334 MG/5ML PO SOLN
30.0000 mL | ORAL | Status: DC | PRN
  Administered 2021-03-27: 30 mL via ORAL
  Filled 2021-03-26: qty 30

## 2021-03-26 MED ORDER — OXYCODONE-ACETAMINOPHEN 5-325 MG PO TABS: 2.0000 | ORAL_TABLET | ORAL | Status: DC | PRN

## 2021-03-26 MED ORDER — PHENYLEPHRINE 40 MCG/ML (10ML) SYRINGE FOR IV PUSH (FOR BLOOD PRESSURE SUPPORT)
80.0000 ug | PREFILLED_SYRINGE | INTRAVENOUS | Status: DC | PRN
  Filled 2021-03-26: qty 10

## 2021-03-26 MED ORDER — ONDANSETRON HCL 4 MG/2ML IJ SOLN
4.0000 mg | Freq: Four times a day (QID) | INTRAMUSCULAR | Status: DC | PRN
Start: 2021-03-26 — End: 2021-03-27
  Administered 2021-03-26: 4 mg via INTRAVENOUS
  Filled 2021-03-26: qty 2

## 2021-03-26 MED ORDER — LACTATED RINGERS IV SOLN
500.0000 mL | INTRAVENOUS | Status: DC | PRN
  Administered 2021-03-26 – 2021-03-27 (×4): 500 mL via INTRAVENOUS

## 2021-03-26 MED ORDER — LACTATED RINGERS IV SOLN: 500.0000 mL | Freq: Once | INTRAVENOUS | Status: DC

## 2021-03-26 MED ORDER — EPHEDRINE 5 MG/ML INJ: 10.0000 mg | INTRAVENOUS | Status: DC | PRN

## 2021-03-26 MED ORDER — ONDANSETRON HCL 4 MG/2ML IJ SOLN: 4.0000 mg | Freq: Four times a day (QID) | INTRAMUSCULAR | Status: DC | PRN

## 2021-03-26 MED ORDER — ACETAMINOPHEN 325 MG PO TABS: 650.0000 mg | ORAL_TABLET | ORAL | Status: DC | PRN

## 2021-03-26 MED ORDER — DIPHENHYDRAMINE HCL 50 MG/ML IJ SOLN: 12.5000 mg | INTRAMUSCULAR | Status: DC | PRN

## 2021-03-26 MED ORDER — OXYTOCIN BOLUS FROM INFUSION
333.0000 mL | Freq: Once | INTRAVENOUS | Status: AC
  Administered 2021-03-27: 333 mL via INTRAVENOUS

## 2021-03-26 MED ORDER — LACTATED RINGERS IV SOLN: INTRAVENOUS | Status: DC

## 2021-03-26 MED ORDER — PHENYLEPHRINE 40 MCG/ML (10ML) SYRINGE FOR IV PUSH (FOR BLOOD PRESSURE SUPPORT)
80.0000 ug | PREFILLED_SYRINGE | INTRAVENOUS | Status: DC | PRN
  Administered 2021-03-27: 80 ug via INTRAVENOUS

## 2021-03-26 MED ORDER — LIDOCAINE HCL (PF) 1 % IJ SOLN
30.0000 mL | INTRAMUSCULAR | Status: AC | PRN
  Administered 2021-03-27: 30 mL via SUBCUTANEOUS
  Filled 2021-03-26: qty 30

## 2021-03-26 MED ORDER — OXYTOCIN-SODIUM CHLORIDE 30-0.9 UT/500ML-% IV SOLN
2.5000 [IU]/h | INTRAVENOUS | Status: DC
  Filled 2021-03-26: qty 500

## 2021-03-26 MED ORDER — FENTANYL-BUPIVACAINE-NACL 0.5-0.125-0.9 MG/250ML-% EP SOLN
12.0000 mL/h | EPIDURAL | Status: DC | PRN
  Filled 2021-03-26: qty 250

## 2021-03-26 MED ORDER — FENTANYL CITRATE (PF) 100 MCG/2ML IJ SOLN
100.0000 ug | INTRAMUSCULAR | Status: DC | PRN
  Administered 2021-03-26 (×2): 100 ug via INTRAVENOUS
  Filled 2021-03-26 (×2): qty 2

## 2021-03-26 MED ORDER — OXYCODONE-ACETAMINOPHEN 5-325 MG PO TABS: 1.0000 | ORAL_TABLET | ORAL | Status: DC | PRN

## 2021-03-26 NOTE — Telephone Encounter (Signed)
Pt called stating that she was having contractions approx every 4 min, pain 7/10, and starting in her back and radiating to the front. She had noted some pink mucous-like discharge when wiping and possible small amount of fluid leaking. Pt tested covid + on 8/10. Pt placed on hold and [place] RN spoke with pt. Pt to call back at 3:30 after taking a warm bath, drinking some water, and monitoring her contractions.

## 2021-03-26 NOTE — Anesthesia Preprocedure Evaluation (Addendum)
Anesthesia Evaluation  Patient identified by MRN, date of birth, ID band Patient awake    Reviewed: Allergy & Precautions, NPO status , Patient's Chart, lab work & pertinent test results  Airway Mallampati: II  TM Distance: >3 FB Neck ROM: Full    Dental no notable dental hx. (+) Teeth Intact, Dental Advisory Given   Pulmonary former smoker,  covid +    Pulmonary exam normal breath sounds clear to auscultation       Cardiovascular Normal cardiovascular exam Rhythm:Regular Rate:Normal     Neuro/Psych negative neurological ROS     GI/Hepatic Neg liver ROS, GERD  ,  Endo/Other  negative endocrine ROS  Renal/GU negative Renal ROS     Musculoskeletal   Abdominal   Peds  Hematology Lab Results      Component                Value               Date                      WBC                      6.6                 03/26/2021                HGB                      13.7                03/26/2021                HCT                      39.7                03/26/2021                MCV                      89.0                03/26/2021                PLT                      155                 03/26/2021              Anesthesia Other Findings All Sulfa  Reproductive/Obstetrics (+) Pregnancy                           Anesthesia Physical Anesthesia Plan  ASA: 2  Anesthesia Plan: Epidural   Post-op Pain Management:    Induction:   PONV Risk Score and Plan:   Airway Management Planned:   Additional Equipment:   Intra-op Plan:   Post-operative Plan:   Informed Consent: I have reviewed the patients History and Physical, chart, labs and discussed the procedure including the risks, benefits and alternatives for the proposed anesthesia with the patient or authorized representative who has indicated his/her understanding and acceptance.       Plan Discussed with:   Anesthesia Plan  Comments: (37 wk Primagravida  for LEA)        Anesthesia Quick Evaluation

## 2021-03-26 NOTE — H&P (Signed)
OBSTETRIC ADMISSION HISTORY AND PHYSICAL  Sarah Blanchard is a 30 y.o. female G1P0 with IUP at 60w0dby LMP presenting for IOL for PROM and contractions that started this am. She reports +FMs, No LOF, no VB, no blurry vision, headaches or peripheral edema, and RUQ pain.  She plans on breast feeding. She request OP Paraguard for birth control. [facility], has been having fever and chills  Dating: By LMP --->  Estimated Date of Delivery: 04/16/21  Sono:    '@[redacted]w[redacted]d'$ , CWD, normal anatomy, Cephalic presentation, Anterior lie, 933g, 19% EFW   Prenatal History/Complications:  -UTI in 3rd trimester (untreated?),  Kidney Stone?,  -COVID + in 3rd trimester  Past Medical History: Past Medical History:  Diagnosis Date   ADD (attention deficit disorder)    Migraines    Thyroid disease 2004   hypothyrodism d/t removed tumor, now resolved   Vaginal Pap smear, abnormal     Past Surgical History: Past Surgical History:  Procedure Laterality Date   COLPOSCOPY     ESOPHAGOGASTRODUODENOSCOPY N/A 03/13/2018   Procedure: ESOPHAGOGASTRODUODENOSCOPY (EGD);  Surgeon: FDanie Binder MD;  [facility];  Service: Endoscopy;  Laterality: N/A;  2:30pm   SAVORY DILATION N/A 03/13/2018   Procedure: SAVORY DILATION;  Surgeon: FDanie Binder MD;  Location: [facility];  Service: Endoscopy;  Laterality: N/A;   THYROIDECTOMY, PARTIAL  2004   WISDOM TOOTH EXTRACTION     [place] 10 years ago    Obstetrical History: OB History     Gravida  1   Para      Term      Preterm      AB      Living         SAB      IAB      Ectopic      Multiple      Live Births              Social History Social History   Socioeconomic History   Marital status: Divorced    Spouse name: Not on file   Number of children: Not on file   Years of education: Not on file   Highest education level: Not on file  Occupational History    Not on file  Tobacco Use   Smoking status: Former    Packs/day: 0.50    Types: Cigarettes   Smokeless tobacco: Never  Vaping Use   Vaping Use: Some days  Substance and Sexual Activity   Alcohol use: Not Currently    Alcohol/week: 0.0 standard drinks    Comment: occ   Drug use: Never   Sexual activity: Yes    Birth control/protection: None  Other Topics Concern   Not on file  Social History Narrative   MARRIED(2018). NO KIDS. 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[employer])   Social Determinants of Health   Financial Resource Strain: Low Risk    Difficulty of Paying Living Expenses: Not hard at all  Food Insecurity: No Food Insecurity   Worried About RCharity fundraiserin the Last Year: Never true   Ran Out of Food in the Last Year: Never true  Transportation Needs: No Transportation Needs   Lack of Transportation (Medical): No   Lack of Transportation (Non-Medical): No  Physical Activity: Sufficiently Active   Days of Exercise per Week: 5 days   Minutes of Exercise per Session: 30 min  Stress: No Stress Concern Present   Feeling of Stress : Not at all  Social Connections: Socially Integrated   Frequency of Communication with Friends and Family: More than three times a week   Frequency of Social Gatherings with Friends and Family: More than three times a week   Attends Religious Services: 1 to 4 times per year   Active Member of [employer] or Organizations: No   Attends Music therapist: 1 to 4 times per year   Marital Status: Living with partner    Family History: Family History  Problem Relation Age of Onset   Heart attack Paternal Grandfather    Cancer Paternal Grandmother        lung   Cancer Father        bladder   Rheum arthritis Mother    Colon cancer Neg Hx    Colon polyps Neg Hx     Allergies: Allergies  Allergen Reactions   Bee Venom Anaphylaxis   Sulfa Antibiotics     Migraines, facial swelling     Medications Prior to Admission  Medication Sig  Dispense Refill Last Dose   Omeprazole 20 MG TBEC Take 1 tablet (20 mg total) by mouth 2 (two) times daily. 60 tablet 3 03/25/2021   Prenatal Vit-Fe Fumarate-FA (PRENATAL VITAMIN PO) Take by mouth.   03/25/2021   cefdinir (OMNICEF) 300 MG capsule Take 1 capsule (300 mg total) by mouth 2 (two) times daily. 20 capsule 0    EPINEPHrine 0.3 MG/0.3ML SOSY Inject 0.3 mLs (0.3 mg total) as directed once as needed for up to 1 dose (allergic reaction). (Patient not taking: No sig reported) 1 each 0    loratadine (CLARITIN) 10 MG tablet Take 10 mg by mouth. (Patient not taking: Reported on 03/20/2021)      promethazine (PHENERGAN) 25 MG tablet Take 1 tablet (25 mg total) by mouth every 6 (six) hours as needed (colicky flank pain). (Patient not taking: Reported on 03/20/2021) 30 tablet 0    sucralfate (CARAFATE) 1 g tablet Take 1 tablet (1 g total) by mouth 4 (four) times daily -  with meals and at bedtime. 90 tablet 3    tamsulosin (FLOMAX) 0.4 MG CAPS capsule Take 1 capsule (0.4 mg total) by mouth daily. (Patient not taking: Reported on 03/20/2021) 30 capsule 1      Review of Systems   All systems reviewed and negative except as stated in HPI  Blood pressure 113/77, pulse 85, temperature 97.6 F (36.4 C), resp. rate 16, height '5\' 5"'$  (1.651 m), weight 92.5 kg, last menstrual period 07/10/2020, SpO2 99 %. General appearance: alert and mild distress Lungs: clear to auscultation bilaterally Heart: regular rate and rhythm Abdomen: soft, non-tender; bowel sounds normal Pelvic: Pooling per nurse Extremities: Homans sign is negative, no sign of DVT Presentation: cephalic Fetal monitoringBaseline: 140 bpm, Variability: Good {> 6 bpm), Accelerations: Reactive, and Decelerations: Absent Uterine activity: q4 min Dilation: 2 Effacement (%): 70 Station: -1 Exam by:: Dr. Thornell Mule   Prenatal labs: ABO, Rh: --/--/O POS (08/11 1900) Antibody: NEG (08/11 1900) Rubella: 1.76 (02/24 1116) RPR: Non Reactive (06/02 0856)   HBsAg: Negative (02/24 1116)  HIV: Non Reactive (06/02 0856)  GBS: Negative/-- (08/05 1417)  2 hr Glucola Negative Genetic screening  Normal Anatomy US Normal  Prenatal Transfer Tool  Maternal Diabetes: No Genetic Screening: Normal Maternal Ultrasounds/Referrals: Normal Fetal Ultrasounds or other Referrals:  None Maternal Substance Abuse:  No Significant Maternal Medications:  None Significant Maternal Lab  Results: Group B Strep negative  Results for orders placed or performed during the hospital encounter of 03/26/21 (from the past 24 hour(s))  Type and screen [place]   Collection Time: 03/26/21  7:00 PM  Result Value Ref Range   ABO/RH(D) O POS    Antibody Screen NEG    Sample Expiration      03/29/2021,2359 Performed at [facility], [place] [address], [place], [place] [address]   CBC   Collection Time: 03/26/21  7:14 PM  Result Value Ref Range   WBC 6.6 4.0 - 10.5 K/uL   RBC 4.46 3.87 - 5.11 MIL/uL   Hemoglobin 13.7 12.0 - 15.0 g/dL   HCT 39.7 36.0 - 46.0 %   MCV 89.0 80.0 - 100.0 fL   MCH 30.7 26.0 - 34.0 pg   MCHC 34.5 30.0 - 36.0 g/dL   RDW 13.2 11.5 - 15.5 %   Platelets 155 150 - 400 K/uL   nRBC 0.0 0.0 - 0.2 %  Fern Test   Collection Time: 03/26/21  7:28 PM  Result Value Ref Range   POCT Fern Test Positive = ruptured amniotic membanes     Patient Active Problem List   Diagnosis Date Noted   Term pregnancy 03/26/2021   Supervision of normal first pregnancy 10/09/2020   Dysphagia, idiopathic 02/15/2018   GERD (gastroesophageal reflux disease) 02/15/2018   Dyspepsia 02/15/2018   ADHD (attention deficit hyperactivity disorder) 05/14/2013   FX CLOSED FIBULA NOS 06/09/2009    Assessment/Plan:  Sarah Blanchard is a 30 y.o. G1P0 at 72w0dhere for IOL for PROM.  #Labor: Foley balloon placed 2030 and will give cytotec as contraction pattern allows #Pain: Epidural PRN. #FWB: Cat 1 #ID: GBS Neg, COVID Positive at home #MOF:  Breast #MOC: OP Paraguard #Circ: Yes #COVID Positive: precautions #UTI/Kidney Stone:  MDR UTI on 7/22 with CVA tenderness.  Unsure if treated based on resistance profile.  Patient symptoms improved overtime, though doesn't believe she passed stone. Will order repeat urine culture [facility] Merl MD  03/26/2021, 9:11 PM

## 2021-03-26 NOTE — MAU Note (Signed)
.  Sarah Blanchard is a 30 y.o. at 74w0dhere in MAU reporting: ctx that are 3 minutes apart. Also reports increase of clear/yellow discharge that started around 1300. Endorses fetal movement. Tested positive for covid yesterday, onset of sx was x2 days ago.  Pain score: 9

## 2021-03-26 NOTE — Telephone Encounter (Signed)
Patient states she has been having contractions since this morning.  States they are consistently 4 minutes a part but is not having any difficulty walking, talking or breathing through contractions.  Baby is moving well.  Having a pink discharge.  Patient states she tested positive for COVID yesterday and is having a fever, chills and scratchy throat.  Taking Tylenol for relief.  Discussed labor with patient.  Informed it sounded like she may be in early labor but advised to try a warm bath and monitor strength of contractions. Will have patient touch base again at 3:30 to determine if she should go ahead to [facility] or wait it out.  Pt agreeable to plan.

## 2021-03-27 ENCOUNTER — Encounter (HOSPITAL_COMMUNITY): Payer: Self-pay | Admitting: Obstetrics & Gynecology

## 2021-03-27 DIAGNOSIS — Z3A37 37 weeks gestation of pregnancy: Secondary | ICD-10-CM

## 2021-03-27 DIAGNOSIS — O9852 Other viral diseases complicating childbirth: Secondary | ICD-10-CM

## 2021-03-27 DIAGNOSIS — U071 COVID-19: Secondary | ICD-10-CM

## 2021-03-27 DIAGNOSIS — O4202 Full-term premature rupture of membranes, onset of labor within 24 hours of rupture: Secondary | ICD-10-CM

## 2021-03-27 LAB — RPR: RPR Ser Ql: NONREACTIVE

## 2021-03-27 MED ORDER — BENZOCAINE-MENTHOL 20-0.5 % EX AERO
1.0000 "application " | INHALATION_SPRAY | CUTANEOUS | Status: DC | PRN
  Administered 2021-03-27: 1 via TOPICAL
  Filled 2021-03-27: qty 56

## 2021-03-27 MED ORDER — IBUPROFEN 600 MG PO TABS
600.0000 mg | ORAL_TABLET | Freq: Four times a day (QID) | ORAL | Status: DC
  Administered 2021-03-27 – 2021-03-28 (×5): 600 mg via ORAL
  Filled 2021-03-27 (×5): qty 1

## 2021-03-27 MED ORDER — DIBUCAINE (PERIANAL) 1 % EX OINT
1.0000 "application " | TOPICAL_OINTMENT | CUTANEOUS | Status: DC | PRN
  Administered 2021-03-28: 1 via RECTAL
  Filled 2021-03-27: qty 28

## 2021-03-27 MED ORDER — SENNOSIDES-DOCUSATE SODIUM 8.6-50 MG PO TABS
2.0000 | ORAL_TABLET | Freq: Every day | ORAL | Status: DC
  Administered 2021-03-28: 2 via ORAL
  Filled 2021-03-27: qty 2

## 2021-03-27 MED ORDER — MEASLES, MUMPS & RUBELLA VAC IJ SOLR: 0.5000 mL | Freq: Once | INTRAMUSCULAR | Status: DC

## 2021-03-27 MED ORDER — CALCIUM CARBONATE ANTACID 500 MG PO CHEW
1.0000 | CHEWABLE_TABLET | Freq: Every day | ORAL | Status: DC
  Administered 2021-03-27: 200 mg via ORAL
  Filled 2021-03-27 (×2): qty 1

## 2021-03-27 MED ORDER — ONDANSETRON HCL 4 MG/2ML IJ SOLN: 4.0000 mg | INTRAMUSCULAR | Status: DC | PRN

## 2021-03-27 MED ORDER — COCONUT OIL OIL
1.0000 "application " | TOPICAL_OIL | Status: DC | PRN
  Administered 2021-03-27: 1 via TOPICAL

## 2021-03-27 MED ORDER — PRENATAL MULTIVITAMIN CH
1.0000 | ORAL_TABLET | Freq: Every day | ORAL | Status: DC
  Administered 2021-03-27 – 2021-03-28 (×2): 1 via ORAL
  Filled 2021-03-27 (×2): qty 1

## 2021-03-27 MED ORDER — WITCH HAZEL-GLYCERIN EX PADS
1.0000 "application " | MEDICATED_PAD | CUTANEOUS | Status: DC | PRN
  Administered 2021-03-27: 1 via TOPICAL

## 2021-03-27 MED ORDER — OXYTOCIN-SODIUM CHLORIDE 30-0.9 UT/500ML-% IV SOLN
1.0000 m[IU]/min | INTRAVENOUS | Status: DC
Start: 2021-03-27 — End: 2021-03-27
  Administered 2021-03-27: 2 m[IU]/min via INTRAVENOUS

## 2021-03-27 MED ORDER — DIPHENHYDRAMINE HCL 25 MG PO CAPS: 25.0000 mg | ORAL_CAPSULE | Freq: Four times a day (QID) | ORAL | Status: DC | PRN

## 2021-03-27 MED ORDER — LACTATED RINGERS AMNIOINFUSION: INTRAVENOUS | Status: DC

## 2021-03-27 MED ORDER — PANTOPRAZOLE SODIUM 40 MG PO TBEC
40.0000 mg | DELAYED_RELEASE_TABLET | Freq: Every day | ORAL | Status: DC
  Administered 2021-03-28: 40 mg via ORAL
  Filled 2021-03-27: qty 1

## 2021-03-27 MED ORDER — FENTANYL-BUPIVACAINE-NACL 0.5-0.125-0.9 MG/250ML-% EP SOLN
EPIDURAL | Status: DC | PRN
  Administered 2021-03-27: 12 mL/h via EPIDURAL

## 2021-03-27 MED ORDER — LIDOCAINE HCL (PF) 1 % IJ SOLN
INTRAMUSCULAR | Status: DC | PRN
  Administered 2021-03-27: 4 mL via EPIDURAL

## 2021-03-27 MED ORDER — ACETAMINOPHEN 325 MG PO TABS
650.0000 mg | ORAL_TABLET | ORAL | Status: DC | PRN
  Administered 2021-03-27 – 2021-03-28 (×2): 650 mg via ORAL
  Filled 2021-03-27 (×2): qty 2

## 2021-03-27 MED ORDER — ZOLPIDEM TARTRATE 5 MG PO TABS: 5.0000 mg | ORAL_TABLET | Freq: Every evening | ORAL | Status: DC | PRN

## 2021-03-27 MED ORDER — TETANUS-DIPHTH-ACELL PERTUSSIS 5-2.5-18.5 LF-MCG/0.5 IM SUSY: 0.5000 mL | PREFILLED_SYRINGE | Freq: Once | INTRAMUSCULAR | Status: DC

## 2021-03-27 MED ORDER — ONDANSETRON HCL 4 MG PO TABS: 4.0000 mg | ORAL_TABLET | ORAL | Status: DC | PRN

## 2021-03-27 MED ORDER — TERBUTALINE SULFATE 1 MG/ML IJ SOLN: 0.2500 mg | Freq: Once | INTRAMUSCULAR | Status: DC | PRN

## 2021-03-27 MED ORDER — FAMOTIDINE 20 MG PO TABS: 10.0000 mg | ORAL_TABLET | Freq: Four times a day (QID) | ORAL | Status: DC | PRN

## 2021-03-27 MED ORDER — SIMETHICONE 80 MG PO CHEW: 80.0000 mg | CHEWABLE_TABLET | ORAL | Status: DC | PRN

## 2021-03-27 NOTE — Progress Notes (Signed)
Sarah Blanchard is a 30 y.o. G1P0 at 58w1dby admitted for SROM  Went to bedside as FSE with decels with contractions. RN at bedside and already repositioning and bolus running.  Subjective: Patient feeling pressure with contractions.  Objective: BP 114/77   Pulse 75   Temp 98.1 F (36.7 C) (Oral)   Resp 16   Ht '5\' 5"'$  (1.651 m)   Wt 92.5 kg   LMP 07/10/2020   SpO2 100%   BMI 33.95 kg/m  No intake/output data recorded. No intake/output data recorded.  FHT:  FHR: 130 bpm, variability: moderate,  accelerations:  Present,  decelerations:  Present at 0504 AM late decel to 70s for 4 min, recovered once contraction over, terbutaline given  UC:   regular, every 2-4 minutes, prolonged contraction at time of decel at 0504 AM SVE:   Dilation: 10 Effacement (%): 60 Station: 0 Exam by:: A. TDurene Romans RN  Labs: Lab Results  Component Value Date   WBC 6.6 03/26/2021   HGB 13.7 03/26/2021   HCT 39.7 03/26/2021   MCV 89.0 03/26/2021   PLT 155 03/26/2021    Assessment / Plan: Augmentation of labor. Patient contracting on own without augmentation. However with prolonged contraction and tachysystole had prolonged deceleration. Terbutaline given which spaced out contractions and no longer had decelerations.   Labor:  +1 station at last check, given deceleration and fetal intolerance to contractions received Terbutaline. Now tracing improved without further decelrations.  Fetal Wellbeing:  Category II - will plan to let patient contract on own now that no longer with decels after Terbutaline - Reassess as needed for fetal intolerance and in 1 hr from Terbutaline consider start pitocin if contractions space out as long as tracing is appropriate - At time of prolonged decel discussed Cesarean and patient was consented but ultimately strip improved Pain Control:  Epidural I/D:   GBS negative Anticipated MOD:   will plan for NSVD, could consider VAVD if appropriate /warranted  Plan discussed  with attending Dr. OToney Sang MD, MPH OB Fellow, Faculty Practice'

## 2021-03-27 NOTE — Progress Notes (Signed)
Labor Progress Note Laiyah L Radell is a 30 y.o. G1P0 at 52w1dpresented for PROM S:  To room for recurrent late and variable decels after epidural placement Feeling more comfortable after epidural  O:  BP 126/79   Pulse 81   Temp 98.1 F (36.7 C) (Oral)   Resp 16   Ht '5\' 5"'$  (1.651 m)   Wt 92.5 kg   LMP 07/10/2020   SpO2 99%   BMI 33.95 kg/m  EFM: 130bpm/moderate variability/+ accels, recurrent late decels and occasional variables  CVE: Dilation: 5.5 Effacement (%): 60 Station: -1 Presentation: Vertex Exam by:: Dr. KThornell Mule  A&P: 30y.o. G1P0 34w1dOL PROM #Labor: Progressing well. S/p Cytotec x1 and foley bulb which came out 0120. SROM forebag clear fluid and internal monitoring placed with FSE and IUPC #Pain: Epidural in place #FWB: Cat 2, interventions include fluid boluses, repositioning, consider terbutaline if no improvement to allow for fetal recovery but fetus reassuring between decels with good variability and accelerations #GBS negative COVID Positive: precautions #UTI/Kidney Stone:  MDR UTI on 7/22 with CVA tenderness.  Unsure if treated based on resistance profile.  Patient symptoms improved overtime, though doesn't believe she passed stone. Will order repeat urine culture for TOSouth [facility] CenterAnWaldon MerlMD 1:41 AM

## 2021-03-27 NOTE — Anesthesia Procedure Notes (Signed)
Epidural Patient location during procedure: OB Start time: 03/26/2021 11:52 PM End time: 03/27/2021 12:14 AM  Staffing Anesthesiologist: Barnet Glasgow, MD Performed: anesthesiologist   Preanesthetic Checklist Completed: patient identified, IV checked, site marked, risks and benefits discussed, surgical consent, monitors and equipment checked, pre-op evaluation and timeout performed  Epidural Patient position: sitting Prep: DuraPrep and site prepped and draped Patient monitoring: continuous pulse ox and blood pressure Approach: midline Injection technique: LOR air  Needle:  Needle type: Tuohy  Needle gauge: 18 G Needle length: 9 cm and 9 Needle insertion depth: 7 cm Catheter type: closed end flexible Catheter size: 20 Guage Catheter at skin depth: 12 cm Test dose: negative  Assessment Events: blood not aspirated, injection not painful, no injection resistance, no paresthesia and negative IV test  Additional Notes Patient identified. Risks/Benefits/Options discussed with patient including but not limited to bleeding, infection, nerve damage, paralysis, failed block, incomplete pain control, headache, blood pressure changes, nausea, vomiting, reactions to medication both or allergic, itching and postpartum back pain. Confirmed with bedside nurse the patient's most recent platelet count. Confirmed with patient that they are not currently taking any anticoagulation, have any bleeding history or any family history of bleeding disorders. Patient expressed understanding and wished to proceed. All questions were answered. Sterile technique was used throughout the entire procedure. Please see nursing notes for vital signs. Test dose was given through epidural needle and negative prior to continuing to dose epidural or start infusion. Warning signs of high block given to the patient including shortness of breath, tingling/numbness in hands, complete motor block, or any concerning symptoms  with instructions to call for help. Patient was given instructions on fall risk and not to get out of bed. All questions and concerns addressed with instructions to call with any issues.   2 Attempt (S) . Patient tolerated procedure well.

## 2021-03-27 NOTE — Anesthesia Postprocedure Evaluation (Signed)
Anesthesia Post Note  Patient: Sarah Blanchard  Procedure(s) Performed: AN AD HOC LABOR EPIDURAL     Patient location during evaluation: Mother Baby Anesthesia Type: Epidural Level of consciousness: awake and alert Pain management: pain level controlled Vital Signs Assessment: post-procedure vital signs reviewed and stable Respiratory status: spontaneous breathing, nonlabored ventilation and respiratory function stable Cardiovascular status: stable Postop Assessment: no headache, no backache and epidural receding Anesthetic complications: no   No notable events documented.  Last Vitals:  Vitals:   03/27/21 1035 03/27/21 1400  BP: 113/72 113/75  Pulse: 87 81  Resp: 18 18  Temp: 36.7 C 36.7 C  SpO2:      Last Pain:  Vitals:   03/27/21 1400  TempSrc: Oral  PainSc:    Pain Goal:                   Rayvon Char

## 2021-03-27 NOTE — Discharge Summary (Signed)
   Postpartum Discharge Summary  Date of Service updated 03/28/21   Patient Name: Sarah Blanchard DOB: 11/01/1990 MRN: [id number]  Date of admission: 03/26/2021 Delivery date:03/27/2021  Delivering provider: OZAN, JENNIFER  Date of discharge: 03/28/2021  Admitting diagnosis: Term pregnancy [Z34.90] Intrauterine pregnancy: [redacted]w[redacted]d     Secondary diagnosis:  Principal Problem:   Vacuum-assisted vaginal delivery Active Problems:   GERD (gastroesophageal reflux disease)  Additional problems: COVID positive    Discharge diagnosis: Term Pregnancy Delivered                                              Post partum procedures: None Augmentation: Pitocin and Cytotec Complications: None  Hospital course: Induction of Labor With Vaginal Delivery   30 y.o. yo G1P1001 at [redacted]w[redacted]d was admitted to the hospital 03/26/2021 for induction of labor.  Indication for induction: PROM.  Patient had an uncomplicated labor course as follows: Membrane Rupture Time/Date: 1:24 AM ,03/27/2021   Delivery Method:Vaginal, Vacuum (Extractor)  Episiotomy: None  Lacerations:  1st degree  Details of delivery can be found in separate delivery note.  Patient had a routine postpartum course. Patient is discharged home 03/28/21.  Newborn Data: Birth date:03/27/2021  Birth time:7:26 AM  Gender:Female  Living status:Living  Apgars:8 ,3  Weight:2305 g   Magnesium Sulfate received: No BMZ received: No Rhophylac: N/A MMR:N/A T-DaP:Given prenatally Flu: N/A Transfusion:No  Physical exam  Vitals:   03/27/21 1035 03/27/21 1400 03/27/21 2209 03/28/21 0604  BP: 113/72 113/75 121/75 115/67  Pulse: 87 81 77 65  Resp: 18 18 18 18  Temp: 98.1 F (36.7 C) 98 F (36.7 C) 97.7 F (36.5 C) 97.8 F (36.6 C)  TempSrc: Oral Oral Oral Oral  SpO2:   100%   Weight:      Height:       General: alert, cooperative, and no distress Lochia: appropriate Uterine Fundus: firm DVT Evaluation: no LE edema, no calf tenderness to  palpation   Labs: Lab Results  Component Value Date   WBC 6.6 03/26/2021   HGB 13.7 03/26/2021   HCT 39.7 03/26/2021   MCV 89.0 03/26/2021   PLT 155 03/26/2021   No flowsheet data found. Edinburgh Score: Edinburgh Postnatal Depression Scale Screening Tool 03/27/2021  I have been able to laugh and see the funny side of things. 0  I have looked forward with enjoyment to things. 0  I have blamed myself unnecessarily when things went wrong. 2  I have been anxious or worried for no good reason. 0  I have felt scared or panicky for no good reason. 0  Things have been getting on top of me. 1  I have been so unhappy that I have had difficulty sleeping. 1  I have felt sad or miserable. 0  I have been so unhappy that I have been crying. 0  The thought of harming myself has occurred to me. 0  Edinburgh Postnatal Depression Scale Total 4    After visit meds:  Allergies as of 03/28/2021       Reactions   Bee Venom Anaphylaxis   Sulfa Antibiotics    Migraines, facial swelling         Medication List     STOP taking these medications    cefdinir 300 MG capsule Commonly known as: OMNICEF   promethazine 25 MG tablet Commonly known as:   PHENERGAN   sucralfate 1 g tablet Commonly known as: Carafate   tamsulosin 0.4 MG Caps capsule Commonly known as: Flomax       TAKE these medications    acetaminophen 325 MG tablet Commonly known as: TYLENOL Take 650 mg by mouth every 6 (six) hours as needed for mild pain, fever or headache. Notes to patient: Last today at 3:51   ibuprofen 200 MG tablet Commonly known as: ADVIL Take 2 tablets (400 mg total) by mouth every 6 (six) hours as needed.   omeprazole 40 MG capsule Commonly known as: PRILOSEC Take 40 mg by mouth 2 (two) times daily. What changed: Another medication with the same name was removed. Continue taking this medication, and follow the directions you see here.   PRENATAL VITAMIN PO Take 1 tablet by mouth daily.         Discharge home in stable condition Infant Feeding: Breast Infant Disposition: home with mother Discharge instruction: per After Visit Summary and Postpartum booklet. Activity: Advance as tolerated. Pelvic rest for 6 weeks.  Diet: routine diet Future Appointments: Future Appointments  Date Time Provider Department Center  05/01/2021 11:50 AM Booker, Kimberly R, CNM [facility]   Follow up Visit: Message sent by Dr. Albert on 8/13 at 9:30 AM.  Please schedule this patient for a In person postpartum visit in 4 weeks with the following provider: Any provider. Additional Postpartum F/U: None   Low risk pregnancy complicated by:  N/A Delivery mode:  Vaginal, Vacuum (Extractor)  Anticipated Birth Control:  IUD outpatient ([place])  03/28/2021 Christina M Albert, MD Obstetrics Fellow  Faculty Practice    

## 2021-03-27 NOTE — Lactation Note (Signed)
This note was copied from a baby's chart. Lactation Consultation Note  Patient Name: Sarah Blanchard [id number] Date: 03/27/2021 Reason for consult: L&D Initial assessment;Early term 37-38.6wks;Infant < 6lbs;1st time breastfeeding Age:31 hours  P1, Baby 26w1dand cueing.  Mother has been hand expressing at home and has good flow. Hand express helped evert nipples and baby was able to latch.  Mother was able to guide him back on breast when he came off. Lactation to follow up on MBU.   Maternal Data Has patient been taught Hand Expression?: Yes Does the patient have breastfeeding experience prior to this delivery?: No  Feeding Mother's Current Feeding Choice: Breast Milk  LATCH Score Latch: Repeated attempts needed to sustain latch, nipple held in mouth throughout feeding, stimulation needed to elicit sucking reflex.  Audible Swallowing: A few with stimulation  Type of Nipple: Everted at rest and after stimulation (flat at rest, evert with stimulation)  Comfort (Breast/Nipple): Soft / non-tender  Hold (Positioning): Assistance needed to correctly position infant at breast and maintain latch.  LATCH Score: 7   Lactation Tools Discussed/Used    Interventions Interventions: Assisted with latch;Skin to skin;Hand express;Education  Discharge Pump: Personal;DEBP  Consult Status Consult Status: Follow-up Date: 03/27/21 Follow-up type: In-patient    BVivianne MasterBVadnais [facility] Surgery Center8/07/2021, 8:58 AM

## 2021-03-27 NOTE — Lactation Note (Signed)
This note was copied from a baby's chart. Lactation Consultation Note  Patient Name: Sarah Blanchard [id number] Date: 03/27/2021 Reason for consult: Initial assessment;Mother's request;1st time breastfeeding;Early term 37-38.6wks;Infant < 6lbs;Maternal endocrine disorder Age:31 hours  LC order added late. Mom offering breast with short feedings and supplementing with 24 cal/oz formula with extra slow flow nipple.  Infant fed 1 hr prior, LC not able to see a latch.   Mom using personal pump with 24 flange. Mom states areola pain with pumping. Mom denied any pain with latching.   Mom try laying back to resorb excess fluid in breasts. Mom using coconut oil for nipple care.  Mom to pre pump 5-10 min before latching.  LPTI guidelines provided infant less than 6 lbs. LC reviewed behaviors to reduce calorie loss including keeping total feeding under 30 min. Mom aware if infant not latching at breast to offer more.  Mom to call for latch assistance with next feeding.   Plan 1. To feed based on cues 8-12x in 24 hr period. Mom to offer breast followed by supplementation with each feeding EBM first followed by formula.  2. Mom set up DEBP q 3hrs for 15 min with 21 flange states comfortable fit.  3. I and O sheet tracking feedings.  4. [facility] brochure of services inpatient and outpatient provided.   All questions answered at the end of the visit.    Maternal Data Has patient been taught Hand Expression?: Yes  Feeding Mother's Current Feeding Choice: Breast Milk Nipple Type: Extra Slow Flow  LATCH Score                    Lactation Tools Discussed/Used Tools: Pump;Flanges;Shells;Coconut oil Flange Size: 21 Breast pump type: Double-Electric Breast Pump Pump Education: Setup, frequency, and cleaning;Milk Storage Reason for Pumping: increase stimulation Pumping frequency: every 3 hrs for 15 min  Interventions Interventions: Breast feeding basics reviewed;Breast  compression;DEBP;Breast massage;Hand express;Expressed milk;Education;Pre-pump if needed;Coconut oil;Shells  Discharge Pump: Personal  Consult Status Consult Status: Follow-up Date: 03/28/21 Follow-up type: In-patient    Daje Stark  Nicholson-Springer 03/27/2021, 8:51 PM

## 2021-03-28 ENCOUNTER — Other Ambulatory Visit: Payer: Self-pay | Admitting: Women's Health

## 2021-03-28 MED ORDER — PANTOPRAZOLE SODIUM 40 MG PO TBEC: 40.0000 mg | DELAYED_RELEASE_TABLET | Freq: Every day | ORAL | Status: DC

## 2021-03-28 MED ORDER — IBUPROFEN 200 MG PO TABS: 400.0000 mg | ORAL_TABLET | Freq: Four times a day (QID) | ORAL | Status: DC | PRN

## 2021-03-28 MED ORDER — PANTOPRAZOLE SODIUM 40 MG PO TBEC
40.0000 mg | DELAYED_RELEASE_TABLET | Freq: Every day | ORAL | Status: DC
  Administered 2021-03-28: 40 mg via ORAL
  Filled 2021-03-28: qty 1

## 2021-03-30 ENCOUNTER — Telehealth: Payer: Medicaid Other | Admitting: Women's Health

## 2021-03-30 LAB — SURGICAL PATHOLOGY

## 2021-04-08 ENCOUNTER — Telehealth (HOSPITAL_COMMUNITY): Payer: Self-pay | Admitting: *Deleted

## 2021-04-08 NOTE — Telephone Encounter (Signed)
Mom reports doing well. No concerns about herself. EPDS = 5 (Hospital score = 4) Mom reports baby is fine. Breastfeeding well. Peeing and pooping without difficulty. No concerns for baby.  Odis Hollingshead, RN 04-08-2021 at 10:39am

## 2021-04-12 ENCOUNTER — Other Ambulatory Visit: Payer: Self-pay | Admitting: Women's Health

## 2021-05-01 ENCOUNTER — Ambulatory Visit (INDEPENDENT_AMBULATORY_CARE_PROVIDER_SITE_OTHER): Payer: 59 | Admitting: Women's Health

## 2021-05-01 ENCOUNTER — Other Ambulatory Visit: Payer: Self-pay

## 2021-05-01 ENCOUNTER — Encounter: Payer: Self-pay | Admitting: Women's Health

## 2021-05-01 DIAGNOSIS — O99345 Other mental disorders complicating the puerperium: Secondary | ICD-10-CM

## 2021-05-01 DIAGNOSIS — F418 Other specified anxiety disorders: Secondary | ICD-10-CM

## 2021-05-01 MED ORDER — SERTRALINE HCL 25 MG PO TABS: 25.0000 mg | ORAL_TABLET | Freq: Every day | ORAL | 6 refills | Status: DC

## 2021-05-01 MED ORDER — POLYETHYLENE GLYCOL 3350 17 G PO PACK: 17.0000 g | PACK | Freq: Every day | ORAL | 6 refills | Status: DC

## 2021-05-01 NOTE — Patient Instructions (Addendum)
NO SEX UNTIL AFTER YOU GET YOUR BIRTH CONTROL   Constipation Drink plenty of fluid, preferably water, throughout the day Eat foods high in fiber such as fruits, vegetables, and grains Exercise, such as walking, is a good way to keep your bowels regular Drink warm fluids, especially warm prune juice, or decaf coffee Eat a 1/2 cup of real oatmeal (not instant), 1/2 cup applesauce, and 1/2-1 cup warm prune juice every day If needed, you may take Colace (docusate sodium) stool softener once or twice a day to help keep the stool soft. If you are pregnant, wait until you are out of your first trimester (12-14 weeks of pregnancy) If you still are having problems with constipation, you may take Miralax once daily as needed to help keep your bowels regular.  If you are pregnant, wait until you are out of your first trimester (12-14 weeks of pregnancy)

## 2021-05-01 NOTE — Progress Notes (Signed)
POSTPARTUM VISIT Patient name: Sarah Blanchard MRN [id number]  Date of birth: 21-Jul-1991 Chief Complaint:   Postpartum Care  History of Present Illness:   Sarah Blanchard is a 30 y.o. G25P1001 Caucasian female being seen today for a postpartum visit. She is 5 weeks postpartum following a vacuum, low at 37.1 gestational weeks. IOL: yes, for PROM. Anesthesia: epidural.  Laceration: 1st degree.  Complications: pt reports birth was very traumatic, 'pushed for 7 hours, ended up w/ vacuum, had to call code on baby'. Thinks she broke her tailbone, has been very painful and has to sit on her Boppy pillow. Pt states she wants a c/s next time. Inpatient contraception: no.   Pregnancy uncomplicated. Tobacco use: former . Substance use disorder: no. Last pap smear: 10/09/20 and results were NILM w/ HRHPV negative. Next pap smear due: 2025 No LMP recorded.  Postpartum course has been complicated by anxiety . Bleeding none. Bowel function is abnormal: constipation, stool softener not helping  . Bladder function is normal. Urinary incontinence? no, fecal incontinence? no Patient is sexually active. Last sexual activity:  9/9 . Desired contraception: IUD.   Upstream - 05/01/21 1150       Pregnancy Intention Screening   Does the patient want to become pregnant in the next year? No    Does the patient's partner want to become pregnant in the next year? No    Would the patient like to discuss contraceptive options today? Yes      Contraception Wrap Up   Current Method No Contraceptive Precautions    End Method No Contraception Precautions    Contraception Counseling Provided Yes            The pregnancy intention screening data noted above was reviewed. Potential methods of contraception were discussed. The patient elected to proceed with No Contraception Precautions.  Edinburgh Postpartum Depression Screening: negative, but does report anxiety, wants to try meds/therapy. Denies SI/HI/II   Edinburgh Postnatal Depression Scale - 05/01/21 1146       Edinburgh Postnatal Depression Scale:  In the Past 7 Days   I have been able to laugh and see the funny side of things. 0    I have looked forward with enjoyment to things. 0    I have blamed myself unnecessarily when things went wrong. 0    I have been anxious or worried for no good reason. 2    I have felt scared or panicky for no good reason. 1    Things have been getting on top of me. 2    I have been so unhappy that I have had difficulty sleeping. 2    I have felt sad or miserable. 1    I have been so unhappy that I have been crying. 0             GAD 7 : Generalized Anxiety Score 01/15/2021 10/09/2020 08/29/2020  Nervous, Anxious, on Edge 0 0 0  Control/stop worrying 0 0 0  Worry too much - different things 0 0 0  Trouble relaxing 0 0 0  Restless 0 0 0  Easily annoyed or irritable 0 0 0  Afraid - awful might happen 0 0 0  Total GAD 7 Score 0 0 0     Baby's course has been uncomplicated. Baby is feeding by breast: milk supply adequate. Infant has a pediatrician/family doctor? Yes.  Childcare strategy if returning to work/school:  did not discuss .  Pt has material needs met  for her and baby: Yes.   Review of Systems:   Pertinent items are noted in HPI Denies Abnormal vaginal discharge w/ itching/odor/irritation, headaches, visual changes, shortness of breath, chest pain, abdominal pain, severe nausea/vomiting, or problems with urination or bowel movements. Pertinent History Reviewed:  Reviewed past medical,surgical, obstetrical and family history.  Reviewed problem list, medications and allergies. OB History  Gravida Para Term Preterm AB Living  _0 SAB IAB Ectopic Multiple Live Births        0 1    # Outcome Date GA Lbr Len/2nd Weight Sex Delivery Anes PTL Lv  1 Term 03/27/21 25w1d01:57 / 04:14 5 lb 1.3 oz (2.305 kg) M Vag-Vacuum EPI, Local  LIV   Physical Assessment:   Vitals:   05/01/21 1129   BP: 109/72  Pulse: 64  Weight: 184 lb (83.5 kg)  Height: _1  (1.651 m)  Body mass index is 30.62 kg/m.       Physical Examination:   General appearance: alert, well appearing, and in no distress  Mental status: alert, oriented to person, place, and time  Skin: warm & dry   Cardiovascular: normal heart rate noted   Respiratory: normal respiratory effort, no distress   Breasts: deferred, no complaints   Abdomen: soft, non-tender   Pelvic: lac healing well, still some sutures present. Thin prep pap obtained: No  Rectal: not examined  Extremities: Edema: none   Chaperone: Peggy Dones         No results found for this or any previous visit (from the past 24 hour(s)).  Assessment & Plan:  1) Postpartum exam 2) 5 wks s/p vacuum, low for NRFHR 3) breast feeding 4) Depression screening 5) Contraception> sex on 9/9, return 9/23 for IUD insertion, no sex until after insertion 6) PP anxiety> rx zoloft 275mIBH referral 7) Possible fx coccyx> use doughnut pillow, has to heal w/ time 8) Constipation> gave printed prevention/relief measures, rx miralax per pt request  Essential components of care per ACOG recommendations:  1.  Mood and well being:  If positive depression screen, discussed and plan developed.  If using tobacco we discussed reduction/cessation and risk of relapse If current substance abuse, we discussed and referral to local resources was offered.   2. Infant care and feeding:  If breastfeeding, discussed returning to work, pumping, breastfeeding-associated pain, guidance regarding return to fertility while lactating if not using another method. If needed, patient was provided with a letter to be allowed to pump q 2-3hrs to support lactation in a private location with access to a refrigerator to store breastmilk.   Recommended that all caregivers be immunized for flu, pertussis and other preventable communicable diseases If pt does not have material needs met for  her/baby, referred to local resources for help obtaining these.  3. Sexuality, contraception and birth spacing Provided guidance regarding sexuality, management of dyspareunia, and resumption of intercourse Discussed avoiding interpregnancy interval <58m34m and recommended birth spacing of 18 months  4. Sleep and fatigue Discussed coping options for fatigue and sleep disruption Encouraged family/partner/community support of 4 hrs of uninterrupted sleep to help with mood and fatigue  5. Physical recovery  If pt had a C/S, assessed incisional pain and providing guidance on normal vs prolonged recovery If pt had a laceration, perineal healing and pain reviewed.  If urinary or fecal incontinence, discussed management and referred to PT or uro/gyn if indicated  Patient is safe to resume physical activity. Discussed  attainment of healthy weight.  6.  Chronic disease management Discussed pregnancy complications if any, and their implications for future childbearing and long-term maternal health. Review recommendations for prevention of recurrent pregnancy complications, such as 17 hydroxyprogesterone caproate to reduce risk for recurrent PTB not applicable, or aspirin to reduce risk of preeclampsia not applicable. Pt had GDM: no. If yes, 2hr GTT scheduled: not applicable. Reviewed medications and non-pregnant dosing including consideration of whether pt is breastfeeding using a reliable resource such as LactMed: yes Referred for f/u w/ PCP or subspecialist providers as indicated: yes  7. Health maintenance Mammogram at 31yo or earlier if indicated Pap smears as indicated  Meds:  Meds ordered this encounter  Medications   sertraline (ZOLOFT) 25 MG tablet    Sig: Take 1 tablet (25 mg total) by mouth daily.    Dispense:  30 tablet    Refill:  6    Order Specific Question:   Supervising Provider    Answer:   Tania Ade H [[id number]]   polyethylene glycol (MIRALAX) 17 g packet    Sig: Take 17 g  by mouth daily.    Dispense:  28 each    Refill:  6    Order Specific Question:   Supervising Provider    Answer:   Florian Buff [[id number]]    Follow-up: Return in about 1 week (around 05/08/2021) for IUD insertion.   Orders Placed This Encounter  Procedures   Amb ref to [place], [facility] 05/01/2021 1:52 PM

## 2021-05-13 ENCOUNTER — Ambulatory Visit (INDEPENDENT_AMBULATORY_CARE_PROVIDER_SITE_OTHER): Payer: 59 | Admitting: Women's Health

## 2021-05-13 ENCOUNTER — Other Ambulatory Visit: Payer: Self-pay

## 2021-05-13 ENCOUNTER — Encounter: Payer: Self-pay | Admitting: Women's Health

## 2021-05-13 VITALS — BP 130/56 | HR 73 | Ht 65.0 in | Wt 186.4 lb

## 2021-05-13 DIAGNOSIS — Z3202 Encounter for pregnancy test, result negative: Secondary | ICD-10-CM | POA: Diagnosis not present

## 2021-05-13 DIAGNOSIS — Z30430 Encounter for insertion of intrauterine contraceptive device: Secondary | ICD-10-CM

## 2021-05-13 LAB — POCT URINE PREGNANCY: Preg Test, Ur: NEGATIVE

## 2021-05-13 MED ORDER — LEVONORGESTREL 20.1 MCG/DAY IU IUD: 1.0000 | INTRAUTERINE_SYSTEM | Freq: Once | INTRAUTERINE | Status: DC

## 2021-05-13 MED ORDER — PARAGARD INTRAUTERINE COPPER IU IUD
1.0000 | INTRAUTERINE_SYSTEM | Freq: Once | INTRAUTERINE | Status: AC
  Administered 2021-05-13: 1 via INTRAUTERINE

## 2021-05-13 NOTE — Progress Notes (Signed)
   IUD INSERTION Patient name: Sarah Blanchard MRN [id number]  Date of birth: [birth year] Subjective Findings:   Sarah Blanchard is a 30 y.o. G52P1001 Caucasian female being seen today for insertion of a Paragard IUD.  No LMP recorded. Last sexual intercourse was >2wks ago Last pap2/24/22. Results were: NILM w/ HRHPV negative  The risks and benefits of the method and placement have been thouroughly reviewed with the patient and all questions were answered.  Specifically the patient is aware of failure rate of 08/998, expulsion of the IUD and of possible perforation.  The patient is aware of irregular bleeding due to the method and understands the incidence of irregular bleeding diminishes with time.  Signed copy of informed consent in chart.   Depression screen [facility] 2/9 01/15/2021 10/09/2020 08/29/2020  Decreased Interest 0 0 0  Down, Depressed, Hopeless 0 0 0  PHQ - 2 Score 0 0 0  Altered sleeping 0 0 0  Tired, decreased energy 1 0 3  Change in appetite 0 0 0  Feeling bad or failure about yourself  0 0 0  Trouble concentrating 0 0 0  Moving slowly or fidgety/restless 0 0 0  Suicidal thoughts 0 0 0  PHQ-9 Score 1 0 3     GAD 7 : Generalized Anxiety Score 01/15/2021 10/09/2020 08/29/2020  Nervous, Anxious, on Edge 0 0 0  Control/stop worrying 0 0 0  Worry too much - different things 0 0 0  Trouble relaxing 0 0 0  Restless 0 0 0  Easily annoyed or irritable 0 0 0  Afraid - awful might happen 0 0 0  Total GAD 7 Score 0 0 0     Pertinent History Reviewed:   Reviewed past medical,surgical, social, obstetrical and family history.  Reviewed problem list, medications and allergies. Objective Findings & Procedure:   Vitals:   05/13/21 1031  BP: (!) 130/56  Pulse: 73  Weight: 186 lb 6.4 oz (84.6 kg)  Height: 5\' 5"  (1.651 m)  Body mass index is 31.02 kg/m.  Results for orders placed or performed in visit on 05/13/21 (from the past 24 hour(s))  POCT urine pregnancy   Collection Time:  05/13/21 10:37 AM  Result Value Ref Range   Preg Test, Ur Negative Negative     Time out was performed.  A graves speculum was placed in the vagina.  The cervix was visualized, prepped using Betadine, and grasped with a single tooth tenaculum. The uterus was found to be anteroflexed and it sounded to 8 cm.  Paragard  IUD placed per manufacturer's recommendations. The strings were trimmed to approximately 3 cm. The patient tolerated the procedure well.   Informal transvaginal sonogram was performed and the proper placement of the IUD was verified.  Chaperone: Celene Squibb   Assessment & Plan:   1) Paragard IUD insertion The patient was given post procedure instructions, including signs and symptoms of infection and to check for the strings after each menses or each month, and refraining from intercourse or anything in the vagina for 3 days. She was given a care card with date IUD placed, and date IUD to be removed. She is scheduled for a f/u appointment in 4 weeks.  Orders Placed This Encounter  Procedures   POCT urine pregnancy    Return in about 4 weeks (around 06/10/2021) for IUD f/u, CNM, in person.  [place], [facility] 05/13/2021 10:55 AM

## 2021-05-13 NOTE — Patient Instructions (Signed)
Nothing in vagina for 3 days (no sex, douching, tampons, etc...) Check your strings once a month to make sure you can feel them, if you are not able to please let us know If you develop a fever of 100.4 or more in the next few weeks, or if you develop severe abdominal pain, please let [place] know Use a backup method of birth control, such as condoms, for 2 weeks  Intrauterine Device Insertion, Care After This sheet gives you information about how to care for yourself after your procedure. Your health care provider may also give you more specific instructions. If you have problems or questions, contact your health care provider. What can I expect after the procedure? After the procedure, it is common to have: Cramps and pain in the abdomen. Bleeding. It may be light or heavy. This may last for a few days. Lower back pain. Dizziness. Headaches. Nausea. Follow these instructions at home:  Before resuming sexual activity, check to make sure that you can feel the IUD string or strings. You should be able to feel the end of the string below the opening of your cervix. If your IUD string is in place, you may resume sexual activity. If you had a hormonal IUD inserted more than 7 days after your most recent period started, you will need to use a backup method of birth control for 7 days after IUD insertion. Ask your health care provider whether this applies to you. Continue to check that the IUD is still in place by feeling for the strings after every menstrual period, or once a month. An IUD will not protect you from sexually transmitted infections (STIs). Use methods to prevent the exchange of body fluids between partners (barrier protection) every time you have sex. Barrier protection can be used during oral, vaginal, or anal sex. Commonly used barrier methods include: Female condom. Female condom. Dental dam. Take over-the-counter and prescription medicines only as told by your health care  provider. Keep all follow-up visits as told by your health care provider. This is important. Contact a health care provider if: You feel light-headed or weak. You have any of the following problems with your IUD string or strings: The string bothers or hurts you or your sexual partner. You cannot feel the string. The string has gotten longer. You can feel the IUD in your vagina. You think you may be pregnant, or you miss your menstrual period. You think you may have a sexually transmitted infection (STI). Get help right away if: You have flu-like symptoms, such as tiredness (fatigue) and muscle aches. You have a fever and chills. You have bleeding that is heavier or lasts longer than a normal menstrual cycle. You have abnormal or bad-smelling discharge from your vagina. You develop abdominal pain that is new, is getting worse, or is not in the same area of earlier cramping and pain. You have pain during sexual activity. Summary After the procedure, it is common to have cramps and pain in the abdomen. It is also common to have light bleeding or heavier bleeding that is like your menstrual period. Continue to check that the IUD is still in place by feeling for the strings after every menstrual period, or once a month. Keep all follow-up visits as told by your health care provider. This is important. Contact your health care provider if you have problems with your IUD strings, such as the string getting longer or bothering you or your sexual partner. This information is not intended  to replace advice given to you by your health care provider. Make sure you discuss any questions you have with your health care provider. Document Revised: 07/24/2019 Document Reviewed: 07/24/2019 Elsevier Patient Education  Excel.

## 2021-05-15 ENCOUNTER — Other Ambulatory Visit: Payer: Self-pay | Admitting: Women's Health

## 2021-06-10 ENCOUNTER — Encounter: Payer: Self-pay | Admitting: Women's Health

## 2021-06-10 ENCOUNTER — Other Ambulatory Visit: Payer: Self-pay

## 2021-06-10 ENCOUNTER — Ambulatory Visit (INDEPENDENT_AMBULATORY_CARE_PROVIDER_SITE_OTHER): Payer: 59 | Admitting: Women's Health

## 2021-06-10 VITALS — BP 102/70 | HR 61 | Ht 65.0 in | Wt 190.5 lb

## 2021-06-10 DIAGNOSIS — Z30431 Encounter for routine checking of intrauterine contraceptive device: Secondary | ICD-10-CM | POA: Diagnosis not present

## 2021-06-10 DIAGNOSIS — F419 Anxiety disorder, unspecified: Secondary | ICD-10-CM

## 2021-06-10 MED ORDER — HYDROXYZINE HCL 10 MG PO TABS: 10.0000 mg | ORAL_TABLET | Freq: Two times a day (BID) | ORAL | 3 refills | Status: DC | PRN

## 2021-06-10 NOTE — Progress Notes (Signed)
GYN VISIT Patient name: Sarah Blanchard MRN [id number]  Date of birth: 06/10/1991 Chief Complaint:   IUD check  History of Present Illness:   Sarah Blanchard is a 30 y.o. G40P1001 Caucasian female being seen today for IUD f/u. Paragard inserted 05/13/21.  Bled x 7d after insertion, a little crampy now like period may start. Able to feel strings. No problems. Tail bone still painful but improving. Still has anxiety, didn't take zoloft- didn't want to take something daily/not be able to stop. Anxiety isn't daily, but would rather have prn med. Has started back work, school, and working out.  No LMP recorded. (Menstrual status: IUD).  The current method of family planning is IUD.  Last pap 10/09/20. Results were: NILM w/ HRHPV negative, wants yearly paps  Depression screen [facility] 2/9 01/15/2021 10/09/2020 08/29/2020  Decreased Interest 0 0 0  Down, Depressed, Hopeless 0 0 0  PHQ - 2 Score 0 0 0  Altered sleeping 0 0 0  Tired, decreased energy 1 0 3  Change in appetite 0 0 0  Feeling bad or failure about yourself  0 0 0  Trouble concentrating 0 0 0  Moving slowly or fidgety/restless 0 0 0  Suicidal thoughts 0 0 0  PHQ-9 Score 1 0 3     GAD 7 : Generalized Anxiety Score 01/15/2021 10/09/2020 08/29/2020  Nervous, Anxious, on Edge 0 0 0  Control/stop worrying 0 0 0  Worry too much - different things 0 0 0  Trouble relaxing 0 0 0  Restless 0 0 0  Easily annoyed or irritable 0 0 0  Afraid - awful might happen 0 0 0  Total GAD 7 Score 0 0 0     Review of Systems:   Pertinent items are noted in HPI Denies fever/chills, dizziness, headaches, visual disturbances, fatigue, shortness of breath, chest pain, abdominal pain, vomiting, abnormal vaginal discharge/itching/odor/irritation, problems with periods, bowel movements, urination, or intercourse unless otherwise stated above.  Pertinent History Reviewed:  Reviewed past medical,surgical, social, obstetrical and family history.  Reviewed problem  list, medications and allergies. Physical Assessment:   Vitals:   06/10/21 0918  BP: 102/70  Pulse: 61  Weight: 190 lb 8 oz (86.4 kg)  Height: 5\' 5"  (1.651 m)  Body mass index is 31.7 kg/m.       Physical Examination:   General appearance: alert, well appearing, and in no distress  Mental status: alert, oriented to person, place, and time  Skin: warm & dry   Cardiovascular: normal heart rate noted  Respiratory: normal respiratory effort, no distress  Abdomen: soft, non-tender   Pelvic: VULVA: normal appearing vulva with no masses, tenderness or lesions, VAGINA: normal appearing vagina with normal color and discharge, no lesions, CERVIX: normal appearing cervix without discharge or lesions, IUD strings visible, tucked behind cx  Extremities: no edema   Chaperone: Levy Pupa    No results found for this or any previous visit (from the past 24 hour(s)).  Assessment & Plan:  1) IUD check> in place  2) Anxiety> prn vistaril, doesn't feel she needs f/u appt, will let me know if she needs anything  Meds:  Meds ordered this encounter  Medications   hydrOXYzine (ATARAX/VISTARIL) 10 MG tablet    Sig: Take 1 tablet (10 mg total) by mouth 2 (two) times daily as needed for anxiety.    Dispense:  60 tablet    Refill:  3    Order Specific Question:   Supervising Provider  Answer:   EURE, LUTHER H [[id number]]    No orders of the defined types were placed in this encounter.   Return for after 2/24 for , Pap & physical.  Roma Schanz CNM, [facility] 06/10/2021 9:45 AM

## 2021-07-30 ENCOUNTER — Telehealth: Payer: Self-pay | Admitting: Licensed Clinical Social Worker

## 2021-07-30 NOTE — Telephone Encounter (Signed)
Called pt regarding IBH referral. Left detailed message requesting callback.

## 2021-08-05 ENCOUNTER — Telehealth: Payer: Self-pay | Admitting: Licensed Clinical Social Worker

## 2021-08-05 NOTE — Telephone Encounter (Signed)
Called pt regarding referral for IBH. Left message requesting callback

## 2021-08-19 ENCOUNTER — Telehealth: Payer: Self-pay | Admitting: Clinical

## 2021-08-19 NOTE — Telephone Encounter (Signed)
Attempt to schedule, per referral; Left HIPPA-compliant message to call back Roselyn Reef from [employer] for [employer] at [facility] at  [phone] [facility] office); left MyChart message for pt.

## 2021-11-10 ENCOUNTER — Other Ambulatory Visit: Payer: 59 | Admitting: Women's Health

## 2021-11-13 ENCOUNTER — Ambulatory Visit (INDEPENDENT_AMBULATORY_CARE_PROVIDER_SITE_OTHER): Payer: Medicaid Other | Admitting: Women's Health

## 2021-11-13 ENCOUNTER — Encounter: Payer: Self-pay | Admitting: Women's Health

## 2021-11-13 ENCOUNTER — Other Ambulatory Visit (HOSPITAL_COMMUNITY)
Admission: RE | Admit: 2021-11-13 | Discharge: 2021-11-13 | Disposition: A | Discharge status: Home / Self Care | Payer: Medicaid Other | Source: Ambulatory Visit | Attending: Women's Health | Admitting: Women's Health

## 2021-11-13 VITALS — BP 120/82 | HR 72 | Ht 65.0 in | Wt 207.0 lb

## 2021-11-13 DIAGNOSIS — N941 Unspecified dyspareunia: Secondary | ICD-10-CM | POA: Diagnosis not present

## 2021-11-13 DIAGNOSIS — Z01419 Encounter for gynecological examination (general) (routine) without abnormal findings: Secondary | ICD-10-CM

## 2021-11-13 DIAGNOSIS — Z1329 Encounter for screening for other suspected endocrine disorder: Secondary | ICD-10-CM | POA: Diagnosis not present

## 2021-11-13 DIAGNOSIS — E890 Postprocedural hypothyroidism: Secondary | ICD-10-CM

## 2021-11-13 DIAGNOSIS — Z0000 Encounter for general adult medical examination without abnormal findings: Secondary | ICD-10-CM

## 2021-11-13 NOTE — Patient Instructions (Signed)
Tips To Increase Milk Supply Lots of water! Enough so that your urine is clear Plenty of calories, if you're not getting enough calories, your milk supply can decrease Breastfeed/pump often, every 2-3 hours x 20-30mins Fenugreek 3 pills 3 times a day, this may make your urine smell like maple syrup Mother's Milk Tea Lactation cookies, google for the recipe Real oatmeal Body Armor sports drinks Greater Than hydration drink  

## 2021-11-13 NOTE — Progress Notes (Signed)
? ?WELL-WOMAN EXAMINATION ?Patient name: Sarah Blanchard MRN [id number]  Date of birth: Nov 19, 1990 ?Chief Complaint:   ?Gynecologic Exam ? ?History of Present Illness:   ?Sarah Blanchard is a 31 y.o. G7P1001 Caucasian female being seen today for a routine well-woman exam.  ?Current complaints: pain w/ sex x ~1.67mhs, denies abnormal discharge, itching/odor/irritation.  Has Paragard IUD, has been having regular periods until Feb- no period, then started spotting 3/6, then had 4 days of clots. ?Breastfeeding 757m old and milk supply is decreasing ? ?PCP: [place] [facility]    ?does desire labs- thyroid labs only d/t h/o partial thyroidectomy ?Patient's last menstrual period was 10/19/2021 (exact date). ?The current method of family planning is  Paragard IUD inserted 05/13/21 .  ?Last pap 10/09/20. Results were: NILM w/ HRHPV negative. H/O abnormal pap: no ?Last mammogram: never. Results were: N/A. Family h/o breast cancer: no ?Last colonoscopy: never. Results were: N/A. Family h/o colorectal cancer: no ? ? ?  11/13/2021  ?  8:57 AM 01/15/2021  ? 10:38 AM 10/09/2020  ?  9:46 AM 08/29/2020  ? 10:06 AM  ?Depression screen PHQ 2/9  ?Decreased Interest 0 0 0 0  ?Down, Depressed, Hopeless 0 0 0 0  ?PHQ - 2 Score 0 0 0 0  ?Altered sleeping 1 0 0 0  ?Tired, decreased energy 1 1 0 3  ?Change in appetite 0 0 0 0  ?Feeling bad or failure about yourself  0 0 0 0  ?Trouble concentrating 1 0 0 0  ?Moving slowly or fidgety/restless 1 0 0 0  ?Suicidal thoughts 0 0 0 0  ?PHQ-9 Score 4 1 0 3  ? ?  ? ?  11/13/2021  ?  8:58 AM 01/15/2021  ? 10:38 AM 10/09/2020  ?  9:46 AM 08/29/2020  ? 10:07 AM  ?GAD 7 : Generalized Anxiety Score  ?Nervous, Anxious, on Edge 1 0 0 0  ?Control/stop worrying 1 0 0 0  ?Worry too much - different things 1 0 0 0  ?Trouble relaxing 1 0 0 0  ?Restless  0 0 0  ?Easily annoyed or irritable 1 0 0 0  ?Afraid - awful might happen 0 0 0 0  ?Total GAD 7 Score  0 0 0  ? ? ? ?Review of Systems:   ?Pertinent items are noted in HPI ?Denies any  headaches, blurred vision, fatigue, shortness of breath, chest pain, abdominal pain, abnormal vaginal discharge/itching/odor/irritation, problems with periods, bowel movements, urination, or intercourse unless otherwise stated above. ?Pertinent History Reviewed:  ?Reviewed past medical,surgical, social and family history.  ?Reviewed problem list, medications and allergies. ?Physical Assessment:  ? ?Vitals:  ? 11/13/21 0854  ?BP: 120/82  ?Pulse: 72  ?Weight: 207 lb (93.9 kg)  ?Height: '5\' 5"'$  (1.651 m)  ?Body mass index is 34.45 kg/m?. ?  ?     Physical Examination:  ? General appearance - well appearing, and in no distress ? Mental status - alert, oriented to person, place, and time ? Psych:  She has a normal mood and affect ? Skin - warm and dry, normal color, no suspicious lesions noted ? Chest - effort normal, all lung fields clear to auscultation bilaterally ? Heart - normal rate and regular rhythm ? Neck:  midline trachea, no thyromegaly or nodules ? Breasts - breasts appear normal, no suspicious masses, no skin or nipple changes or  axillary nodes ? Abdomen - soft, nontender, nondistended, no masses or organomegaly ? Pelvic - VULVA: normal appearing  vulva with no masses, tenderness or lesions  VAGINA: normal appearing vagina with normal color and discharge, no lesions  CERVIX: normal appearing cervix without discharge or lesions, mild tenderness, IUD strings visible and appropriate length ? Thin prep pap is not done  ? UTERUS: uterus is felt to be normal size, shape, consistency and some mild tenderness ? ADNEXA: No adnexal masses, mild bilateral tenderness ? Extremities:  No swelling or varicosities noted ? ?Informal TA u/s: IUD appears to be in correct placement ? ?Chaperone: Celene Squibb   ? ?No results found for this or any previous visit (from the past 24 hour(s)).  ?Assessment & Plan:  ?1) Well-Woman Exam ? ?2) Dyspareunia> CV swab, if neg will get pelvic u/s ? ?3) Decreasing milk supply> gave printed  tips ? ?4) H/O partial thyroidectomy> check labs today ? ?Labs/procedures today: CV swab, labs ? ?Mammogram: @ 32yo, or sooner if problems ?Colonoscopy: @ 32yo, or sooner if problems ? ?Orders Placed This Encounter  ?Procedures  ? TSH  ? T4, free  ? T3  ? ? ?Meds: No orders of the defined types were placed in this encounter. ? ? ?Follow-up: Return in about 1 year (around 11/14/2022) for Physical. ? ?Roma Schanz CNM, WHNP-BC ?11/13/2021 ?9:35 AM  ?

## 2021-11-14 LAB — T3: T3, Total: 153 ng/dL (ref 71–180)

## 2021-11-14 LAB — T4, FREE: Free T4: 1.25 ng/dL (ref 0.82–1.77)

## 2021-11-14 LAB — TSH: TSH: 2.26 u[IU]/mL (ref 0.450–4.500)

## 2021-11-16 LAB — CERVICOVAGINAL ANCILLARY ONLY
Bacterial Vaginitis (gardnerella): NEGATIVE
Candida Glabrata: POSITIVE — AB
Candida Vaginitis: POSITIVE — AB
Chlamydia: NEGATIVE
Comment: NEGATIVE
Comment: NEGATIVE
Comment: NEGATIVE
Comment: NEGATIVE
Comment: NEGATIVE
Comment: NORMAL
Neisseria Gonorrhea: NEGATIVE
Trichomonas: NEGATIVE

## 2021-11-17 MED ORDER — BORIC ACID VAGINAL 600 MG VA SUPP: 600.0000 mg | Freq: Every day | VAGINAL | 0 refills | Status: AC

## 2021-11-17 NOTE — Addendum Note (Signed)
Addended by: Roma Schanz on: 11/17/2021 09:14 AM ? ? Modules accepted: Orders ? ?

## 2021-11-19 ENCOUNTER — Encounter: Payer: Self-pay | Admitting: *Deleted

## 2022-01-28 ENCOUNTER — Encounter: Payer: Self-pay | Admitting: Women's Health
